# Patient Record
Sex: Female | Born: 1970 | Race: White | Hispanic: No | Marital: Single | Smoking: Former smoker
Health system: Southern US, Community
[De-identification: ages and names within clinical notes are randomized; demographics above are authoritative.]

## PROBLEM LIST (undated history)

## (undated) DIAGNOSIS — E559 Vitamin D deficiency, unspecified: Secondary | ICD-10-CM

## (undated) DIAGNOSIS — E039 Hypothyroidism, unspecified: Secondary | ICD-10-CM

## (undated) DIAGNOSIS — K635 Polyp of colon: Secondary | ICD-10-CM

## (undated) DIAGNOSIS — E669 Obesity, unspecified: Secondary | ICD-10-CM

## (undated) DIAGNOSIS — K279 Peptic ulcer, site unspecified, unspecified as acute or chronic, without hemorrhage or perforation: Secondary | ICD-10-CM

## (undated) HISTORY — DX: Hypothyroidism, unspecified: E03.9

## (undated) HISTORY — DX: Polyp of colon: K63.5

## (undated) HISTORY — DX: Obesity, unspecified: E66.9

## (undated) HISTORY — DX: Vitamin D deficiency, unspecified: E55.9

## (undated) HISTORY — PX: OTHER SURGICAL HISTORY: SHX169

## (undated) HISTORY — DX: Peptic ulcer, site unspecified, unspecified as acute or chronic, without hemorrhage or perforation: K27.9

---

## 2012-05-23 ENCOUNTER — Ambulatory Visit (INDEPENDENT_AMBULATORY_CARE_PROVIDER_SITE_OTHER): Payer: BC Managed Care – PPO | Admitting: Family Medicine

## 2012-05-23 VITALS — BP 126/86 | HR 83 | Temp 98.4°F | Resp 18 | Ht 64.0 in | Wt 166.4 lb

## 2012-05-23 DIAGNOSIS — J069 Acute upper respiratory infection, unspecified: Secondary | ICD-10-CM

## 2012-05-23 MED ORDER — AMOXICILLIN 875 MG PO TABS: ORAL_TABLET | ORAL | Status: DC

## 2012-05-23 NOTE — Progress Notes (Signed)
New Patient Visit:  HPI:  URI Symptoms Onset: 2-3 days  Description: nasal congestion, R ear congestion, R ear pain  Modifying factors:  none  Symptoms Nasal discharge: yes Fever: no Sore throat: minimal Cough: mild  Wheezing: no Ear pain: yes GI symptoms: yes Sick contacts: yes  Red Flags  Stiff neck: no Dyspnea: no Rash: no Swallowing difficulty: no  Sinusitis Risk Factors Headache/face pain: no Double sickening: no tooth pain: n  Allergy Risk Factors Sneezing: no Itchy scratchy throat: no Seasonal symptoms: no  Flu Risk Factors Headache: no muscle aches: no severe fatigue: no    There is no problem list on file for this patient.  Past Medical History: No past medical history on file.  Past Surgical History: No past surgical history on file.  Social History: History   Social History  . Marital Status: Single    Spouse Name: N/A    Number of Children: N/A  . Years of Education: N/A   Social History Main Topics  . Smoking status: Former Games developer  . Smokeless tobacco: None  . Alcohol Use: None  . Drug Use: None  . Sexually Active: None   Other Topics Concern  . None   Social History Narrative  . None    Family History: No family history on file.  Allergies: No Known Allergies  Current Outpatient Prescriptions  Medication Sig Dispense Refill  . amoxicillin (AMOXIL) 875 MG tablet 1 tab bid, use if symptoms not improved in 3-5 days (05/27/12)  20 tablet  0  . omeprazole (PRILOSEC) 20 MG capsule Take 20 mg by mouth 2 (two) times daily.      . sertraline (ZOLOFT) 25 MG tablet Take 25 mg by mouth daily.      . Vitamin D, Ergocalciferol, (DRISDOL) 50000 UNITS CAPS Take 50,000 Units by mouth.       Review Of Systems: 12 point ROS negative except as noted above in HPI.   Physical Exam: Filed Vitals:   05/23/12 1203  BP: 126/86  Pulse: 83  Temp: 98.4 F (36.9 C)  Resp: 18   General: alert and cooperative HEENT: PERRLA, extra ocular  movement intact and +nasal erythema, rhinorrhea bilaterally, + post oropharyngeal erythema  Heart: S1, S2 normal, no murmur, rub or gallop, regular rate and rhythm Lungs: clear to auscultation and no wheezes or rales Abdomen: abdomen is soft without significant tenderness, masses, organomegaly or guarding Extremities: extremities normal, atraumatic, no cyanosis or edema Skin:no rashes Neurology: normal without focal findings, mental status, speech normal, alert and oriented x3, PERLA and reflexes normal and symmetric  Labs and Imaging: No results found for this basename: na, k, cl, co2, bun, creatinine, glucose   No results found for this basename: WBC, HGB, HCT, MCV, PLT    Assessment and Plan: Suspect this is likely a URI.  Given ear pain, will give prophylactic Rx of amox if sxs not improved in 3-5 days.  Discussed infectious and resp red flags.  Follow up as needed.      The patient and/or caregiver has been counseled thoroughly with regard to treatment plan and/or medications prescribed including dosage, schedule, interactions, rationale for use, and possible side effects and they verbalize understanding. Diagnoses and expected course of recovery discussed and will return if not improved as expected or if the condition worsens. Patient and/or caregiver verbalized understanding.

## 2012-05-25 ENCOUNTER — Ambulatory Visit: Payer: BC Managed Care – PPO | Admitting: Family Medicine

## 2012-05-25 VITALS — BP 124/88 | HR 60 | Temp 98.2°F | Resp 16

## 2012-05-25 DIAGNOSIS — A281 Cat-scratch disease: Secondary | ICD-10-CM

## 2012-05-25 DIAGNOSIS — R509 Fever, unspecified: Secondary | ICD-10-CM

## 2012-05-25 LAB — POCT CBC
Granulocyte percent: 72.7 %G (ref 37–80)
HCT, POC: 48 % — AB (ref 37.7–47.9)
Lymph, poc: 1.2 (ref 0.6–3.4)
MCHC: 31.9 g/dL (ref 31.8–35.4)
MCV: 97.8 fL — AB (ref 80–97)
MID (cbc): 0.4 (ref 0–0.9)
POC LYMPH PERCENT: 20.7 %L (ref 10–50)
Platelet Count, POC: 248 10*3/uL (ref 142–424)
RDW, POC: 12.9 %

## 2012-05-25 MED ORDER — AZITHROMYCIN 250 MG PO TABS: ORAL_TABLET | ORAL | Status: AC

## 2012-05-25 NOTE — Progress Notes (Signed)
[facility] [address], [place] [address] [phone]  Date:  05/25/2012   Name:  Tamara Hicks   DOB:  1971/03/29   MRN:  [id number]  PCP:  No primary provider on file.    Chief Complaint: Follow-up   History of Present Illness:  Tamara Hicks is a 41 y.o. very pleasant female patient who presents with the following:  Here today for follow- up. Seen 2 days ago with URI symptoms.  She was diagnosed with a likely viral URI.   She was scratched by a cat on her face about one month ago.  The wound took a long time to heal.   Then a few days ago she developed some swelling of the lymph node on the right side of her jaw.  This was present at her visit on the 4th but was not nearly as bad as it is now.   She had a subjective temperature last night.  She had a sweat overnight.  She has not noted any other enlarged or tender nodes.  She did have nausea and vomiting for one day- a couple of days ago. Now ok.  She has some ST but this may be due to her enlarged node.   She did start taking amoxicillin 2 days ago.  This has not helped so far.   LMP was about 2 weeks ago.    Her daughter has also been seen here this week, and was treated for enlarged inginal node.  She also has been scratched by the same cats as her mother.    There is no problem list on file for this patient.   No past medical history on file.  No past surgical history on file.  History  Substance Use Topics  . Smoking status: Former Games developer  . Smokeless tobacco: Not on file  . Alcohol Use: Not on file    No family history on file.  No Known Allergies  Medication list has been reviewed and updated.  Current Outpatient Prescriptions on File Prior to Visit  Medication Sig Dispense Refill  . amoxicillin (AMOXIL) 875 MG tablet 1 tab bid, use if symptoms not improved in 3-5 days (05/27/12)  20 tablet  0  . omeprazole (PRILOSEC) 20 MG capsule Take 20 mg by mouth 2 (two) times daily.        . sertraline (ZOLOFT) 25 MG tablet Take 50 mg by mouth daily.       . Vitamin D, Ergocalciferol, (DRISDOL) 50000 UNITS CAPS Take 50,000 Units by mouth.        Review of Systems:  As per HPI- otherwise negative.   Physical Examination: Filed Vitals:   05/25/12 1329  BP: 124/88  Pulse: 60  Temp: 98.2 F (36.8 C)  Resp: 16   There were no vitals filed for this visit. There is no height or weight on file to calculate BMI. Ideal Body Weight:    GEN: WDWN, NAD, Non-toxic, A & O x 3 HEENT: Atraumatic, Normocephalic. Neck supple. No masses, No LAD. Visible swelling of right submandibular node.  Oropharynx wnl, TM wnl bilaterally. No other nodes in axillae or supraclavicular areas.   Ears and Nose: No external deformity. CV: RRR, No M/G/R. No JVD. No thrill. No extra heart sounds. PULM: CTA B, no wheezes, crackles, rhonchi. No retractions. No resp. distress. No accessory muscle use. ABD: S, NT, ND,  EXTR: No c/c/e NEURO Normal gait.  PSYCH: Normally interactive. Conversant. Not depressed or anxious  appearing.  Calm demeanor.   Results for orders placed in visit on 05/25/12  POCT CBC      Component Value Range   WBC 5.9  4.6 - 10.2 K/uL   Lymph, poc 1.2  0.6 - 3.4   POC LYMPH PERCENT 20.7  10 - 50 %L   MID (cbc) 0.4  0 - 0.9   POC MID % 6.6  0 - 12 %M   POC Granulocyte 4.3  2 - 6.9   Granulocyte percent 72.7  37 - 80 %G   RBC 4.91  4.04 - 5.48 M/uL   Hemoglobin 15.3  12.2 - 16.2 g/dL   HCT, POC 04.5 (*) 40.9 - 47.9 %   MCV 97.8 (*) 80 - 97 fL   MCH, POC 31.2  27 - 31.2 pg   MCHC 31.9  31.8 - 35.4 g/dL   RDW, POC 81.1     Platelet Count, POC 248  142 - 424 K/uL   MPV 8.3  0 - 99.8 fL    Assessment and Plan: 1. Fever  POCT CBC, azithromycin (ZITHROMAX) 250 MG tablet  2. Cat-scratch disease     Suspect that Demetrius and her daughter both have cat scratch disease.  Will stop her amox, and start on azithromycin today.  She is to let me know if she is not feeling much  better within 2 or 3 days- Sooner if worse.   Gave note for her job.   Abbe Amsterdam, MD

## 2013-09-10 ENCOUNTER — Ambulatory Visit (INDEPENDENT_AMBULATORY_CARE_PROVIDER_SITE_OTHER): Payer: BC Managed Care – PPO | Admitting: Family Medicine

## 2013-09-10 ENCOUNTER — Ambulatory Visit: Payer: BC Managed Care – PPO

## 2013-09-10 VITALS — BP 130/88 | HR 82 | Temp 98.0°F | Resp 16 | Ht 64.0 in | Wt 215.0 lb

## 2013-09-10 DIAGNOSIS — Y92009 Unspecified place in unspecified non-institutional (private) residence as the place of occurrence of the external cause: Secondary | ICD-10-CM

## 2013-09-10 DIAGNOSIS — S300XXA Contusion of lower back and pelvis, initial encounter: Secondary | ICD-10-CM

## 2013-09-10 DIAGNOSIS — S93402A Sprain of unspecified ligament of left ankle, initial encounter: Secondary | ICD-10-CM

## 2013-09-10 DIAGNOSIS — E669 Obesity, unspecified: Secondary | ICD-10-CM | POA: Insufficient documentation

## 2013-09-10 DIAGNOSIS — W19XXXA Unspecified fall, initial encounter: Secondary | ICD-10-CM

## 2013-09-10 DIAGNOSIS — S93409A Sprain of unspecified ligament of unspecified ankle, initial encounter: Secondary | ICD-10-CM

## 2013-09-10 MED ORDER — HYDROCODONE-ACETAMINOPHEN 5-325 MG PO TABS: 1.0000 | ORAL_TABLET | Freq: Three times a day (TID) | ORAL | Status: DC | PRN

## 2013-09-10 NOTE — Progress Notes (Signed)
Fit/train with crutches patient will be touch down weight bearing for this injury.

## 2013-09-10 NOTE — Patient Instructions (Signed)
Use the hydrocodone as needed for pain- remember it can make you sleepy so do not use it when you need to drive.    Use the "aircast" brace for your ankle as needed.  Take it easy. Use ice and heat as needed for your ankle and back.    Let me know if you are not better in the next few days- Sooner if worse.

## 2013-09-10 NOTE — Progress Notes (Signed)
[facility] [place] [address], [place] [address] [phone]  Date:  09/10/2013   Name:  Tamara Hicks   DOB:  1970/11/30   MRN:  [id number]  PCP:  No primary provider on file.    Chief Complaint: Fall   History of Present Illness:  Karl Wiederhold is a 42 y.o. very pleasant female patient who presents with the following:  Here today with an ankle injury.   Last night she slipped down several steps.  She was going down some steps while her feet were slick from a pedicure- her feet went out from under her.  She fell onto her behind- hurting her left ankle and her behind.  Her right ankle is a bit sore but not seriously.  She thinks she might have broken her tailbone.  She did not hit her head or have any LOC.    Her LMP was earlier this month.   She could not walk on her ankle for a couple of hours.  However her ankle then improved so she could walk.  However right now her back hurts a lot when she walks.  No numbness or weakness in her legs.   She is generally healthy except for overweight.   NKDA There are no active problems to display for this patient.   History reviewed. No pertinent past medical history.  Past Surgical History  Procedure Laterality Date  . Cesarean section      History  Substance Use Topics  . Smoking status: Former Games developer  . Smokeless tobacco: Not on file  . Alcohol Use: Not on file    Family History  Problem Relation Age of Onset  . Hypertension Mother     No Known Allergies  Medication list has been reviewed and updated.  Current Outpatient Prescriptions on File Prior to Visit  Medication Sig Dispense Refill  . omeprazole (PRILOSEC) 20 MG capsule Take 20 mg by mouth 2 (two) times daily.      Marland Kitchen amoxicillin (AMOXIL) 875 MG tablet 1 tab bid, use if symptoms not improved in 3-5 days (05/27/12)  20 tablet  0  . busPIRone (BUSPAR) 5 MG tablet Take 5 mg by mouth 3 (three) times daily.      . sertraline (ZOLOFT) 25 MG  tablet Take 50 mg by mouth daily.       . Vitamin D, Ergocalciferol, (DRISDOL) 50000 UNITS CAPS Take 50,000 Units by mouth.       No current facility-administered medications on file prior to visit.    Review of Systems:  As per HPI- otherwise negative.   Physical Examination: Filed Vitals:   09/10/13 0948  BP: 130/88  Pulse: 82  Temp: 98 F (36.7 C)  Resp: 16   Filed Vitals:   09/10/13 0948  Height: 5\' 4"  (1.626 m)  Weight: 215 lb (97.523 kg)   Body mass index is 36.89 kg/(m^2). Ideal Body Weight: Weight in (lb) to have BMI = 25: 145.3  GEN: WDWN, NAD, Non-toxic, A & O x 3, obese, looks well HEENT: Atraumatic, Normocephalic. Neck supple. No masses, No LAD.  Bilateral TM wnl, oropharynx normal.  PEERL,EOMI.   c spine is negative for tenderness, full ROM in all directions Ears and Nose: No external deformity. CV: RRR, No M/G/R. No JVD. No thrill. No extra heart sounds. PULM: CTA B, no wheezes, crackles, rhonchi. No retractions. No resp. distress. No accessory muscle use. ABD: S, NT, ND EXTR: No c/c/e NEURO Normal gait.  PSYCH:  Normally interactive. Conversant. Not depressed or anxious appearing.  Calm demeanor.  Left ankle: minimal puffiness laterally.  Tender over the lateral epicondyle.  No bruise, normal ankle ROM, foot is negative Achilles is intact Back: negaitve SLR bilaterally, no numbness or weakness, normal achilles DTR bilaterally.  She is tender over the lower back muscles bilaterally and over the coccyx  [facility] reading (PRIMARY) by  Dr. Patsy Lager. Left ankle:negative Sacrum/ coccyx:  Negative for fracture  LEFT ANKLE COMPLETE - 3+ VIEW  COMPARISON: None.  FINDINGS: No fracture. Ankle mortise is normally space and aligned. There are small dorsal plantar calcaneal spurs. The soft tissues are unremarkable.  IMPRESSION: No fracture or ankle joint abnormality  SACRUM AND COCCYX - 2+ VIEW  COMPARISON: None.  FINDINGS: No fracture is identified involving  the sacrum or coccyx. No bony lesions are identified. The sacroiliac joints have a symmetric and normal appearance.  IMPRESSION: Normal sacrum and coccyx.  Assessment and Plan: Fall at home, initial encounter  Sprain of left ankle, initial encounter - Plan: DG Ankle Complete Left, HYDROcodone-acetaminophen (NORCO/VICODIN) 5-325 MG per tablet  Contusion of coccyx, initial encounter - Plan: DG Sacrum/Coccyx, HYDROcodone-acetaminophen (NORCO/VICODIN) 5-325 MG per tablet  Ankle sprain, coccyx contusion Given an air cast and crutches for symptom relief.  vicodin for use prn if OTC pain medications are not enough.  Note for work for tomorrow.  See patient instructions for more details.     Signed Abbe Amsterdam, MD

## 2017-12-19 ENCOUNTER — Encounter (INDEPENDENT_AMBULATORY_CARE_PROVIDER_SITE_OTHER): Payer: Self-pay

## 2017-12-19 ENCOUNTER — Ambulatory Visit: Payer: Medicaid Other | Admitting: Neurology

## 2017-12-19 ENCOUNTER — Other Ambulatory Visit: Payer: Self-pay

## 2017-12-19 ENCOUNTER — Encounter: Payer: Self-pay | Admitting: Neurology

## 2017-12-19 VITALS — BP 144/101 | HR 91 | Ht 63.5 in | Wt 215.0 lb

## 2017-12-19 DIAGNOSIS — G8929 Other chronic pain: Secondary | ICD-10-CM | POA: Diagnosis not present

## 2017-12-19 DIAGNOSIS — M542 Cervicalgia: Secondary | ICD-10-CM

## 2017-12-19 MED ORDER — NORTRIPTYLINE HCL 10 MG PO CAPS: ORAL_CAPSULE | ORAL | 3 refills | Status: DC
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
## 2018-03-20 ENCOUNTER — Ambulatory Visit: Payer: Medicaid Other | Admitting: Nurse Practitioner

## 2018-03-20 ENCOUNTER — Encounter: Payer: Self-pay | Admitting: Nurse Practitioner

## 2018-03-20 ENCOUNTER — Telehealth: Payer: Self-pay | Admitting: Nurse Practitioner

## 2018-03-20 VITALS — BP 141/90 | HR 97 | Ht 63.5 in | Wt 231.2 lb

## 2018-03-20 DIAGNOSIS — M542 Cervicalgia: Secondary | ICD-10-CM

## 2018-03-20 DIAGNOSIS — Z6841 Body Mass Index (BMI) 40.0 and over, adult: Secondary | ICD-10-CM | POA: Diagnosis not present

## 2018-03-20 MED ORDER — TIZANIDINE HCL 4 MG PO TABS: 4.0000 mg | ORAL_TABLET | Freq: Four times a day (QID) | ORAL | 4 refills | Status: DC | PRN

## 2018-03-20 MED ORDER — NORTRIPTYLINE HCL 10 MG PO CAPS: ORAL_CAPSULE | ORAL | 4 refills | Status: DC

## 2018-03-20 NOTE — Patient Instructions (Signed)
Pt continues to have significant neck pain will get MRI  Continue Nortriptyline 10mg  (3caps) at night Add on tizanidine 4 mg as needed F/U 4 months

## 2018-03-20 NOTE — Telephone Encounter (Signed)
Medicaid order sent to [facility]. They will reach out to the pt to schedule and will obtain auth.

## 2018-04-06 ENCOUNTER — Other Ambulatory Visit: Payer: Self-pay

## 2018-04-20 ENCOUNTER — Ambulatory Visit
Admission: RE | Admit: 2018-04-20 | Discharge: 2018-04-20 | Disposition: A | Discharge status: Home / Self Care | Payer: Medicaid Other | Source: Ambulatory Visit | Attending: Nurse Practitioner | Admitting: Nurse Practitioner

## 2018-04-20 DIAGNOSIS — M542 Cervicalgia: Secondary | ICD-10-CM

## 2018-04-23 ENCOUNTER — Telehealth: Payer: Self-pay | Admitting: Neurology

## 2018-04-23 ENCOUNTER — Other Ambulatory Visit: Payer: Self-pay | Admitting: Neurology

## 2018-04-23 DIAGNOSIS — M899 Disorder of bone, unspecified: Secondary | ICD-10-CM

## 2018-04-23 NOTE — Telephone Encounter (Signed)
Medicaid order sent to [facility]. They will obtain the auth and will reach out to the pt to schedule.  °

## 2018-04-23 NOTE — Telephone Encounter (Signed)
I called the patient.  The MRI of the cervical spine does not show any surgically amenable lesions.  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  The patient is amenable to this.    MRI cervical 04/21/18:  IMPRESSION: This MRI of the cervical spine without contrast shows the following: 1.   There is mild spinal stenosis at C6-C7 and milder degenerative changes at C5-C6.  There is no nerve root compression or other levels 2.   There is abnormal signal involving the T2 vertebral body.  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  However, as there is some involvement of the pedicle consider confirming with CT imaging through the T2 vertebral body

## 2018-04-30 ENCOUNTER — Telehealth: Payer: Self-pay | Admitting: Neurology

## 2018-04-30 ENCOUNTER — Ambulatory Visit
Admission: RE | Admit: 2018-04-30 | Discharge: 2018-04-30 | Disposition: A | Discharge status: Home / Self Care | Payer: Medicaid Other | Source: Ambulatory Visit | Attending: Neurology | Admitting: Neurology

## 2018-04-30 DIAGNOSIS — M899 Disorder of bone, unspecified: Secondary | ICD-10-CM

## 2018-04-30 NOTE — Telephone Encounter (Signed)
I called the patient. The CT of the thoracic spine shows a benign bone lesion. 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 Not sure if the patient is aware of this, will send a note to the primary MD.   CT thoracic 04/30/18:  IMPRESSION: 1. No worrisome lesions of the thoracic spine. 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 2. 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 I recommend thyroid ultrasound for further characterization if this has not been previously assessed.

## 2018-06-19 ENCOUNTER — Ambulatory Visit (HOSPITAL_COMMUNITY)
Admission: EM | Admit: 2018-06-19 | Discharge: 2018-06-19 | Disposition: A | Discharge status: Home / Self Care | Payer: Medicaid Other | Attending: Family Medicine | Admitting: Family Medicine

## 2018-06-19 ENCOUNTER — Encounter (HOSPITAL_COMMUNITY): Payer: Self-pay | Admitting: Emergency Medicine

## 2018-06-19 ENCOUNTER — Ambulatory Visit (INDEPENDENT_AMBULATORY_CARE_PROVIDER_SITE_OTHER): Payer: Medicaid Other

## 2018-06-19 ENCOUNTER — Other Ambulatory Visit: Payer: Self-pay

## 2018-06-19 DIAGNOSIS — S99921A Unspecified injury of right foot, initial encounter: Secondary | ICD-10-CM

## 2018-06-19 NOTE — ED Provider Notes (Signed)
[facility]    CSN: [id number] Arrival date & time: 06/19/18  1720     History   Chief Complaint Chief Complaint  Patient presents with  . Fall  . Foot Injury    right    HPI Tamara Hicks is a 47 y.o. female no contributing past medical history presenting today for evaluation of right foot injury.  Patient had a fall last night, believes her foot got caught under a swinging door which caused her to fall.  She denies hitting head or loss of consciousness.  Since she has had pain mainly to her right pinky toe and distal foot.  States that earlier today her pain was relatively minimal and was weightbearing tolerably, but later this evening she has developed increased pain.  She also has a small cut to her toe.   HPI  Past Medical History:  Diagnosis Date  . Colon polyp   . Hypothyroidism   . Obesity   . Peptic ulcer disease    Gastric ulcer  . Vitamin D deficiency     Patient Active Problem List   Diagnosis Date Noted  . Neck pain 03/20/2018  . Obesity, unspecified 09/10/2013    Past Surgical History:  Procedure Laterality Date  . CESAREAN SECTION    . Renal calculi resection      OB History   None      Home Medications    Prior to Admission medications   Medication Sig Start Date End Date Taking? Authorizing Provider  buPROPion (WELLBUTRIN XL) 300 MG 24 hr tablet Take 300 mg by mouth daily. 02/22/18  Yes [provider]  dexlansoprazole (DEXILANT) 60 MG capsule Take 60 mg by mouth daily.   Yes [provider]  levothyroxine (SYNTHROID, LEVOTHROID) 50 MCG tablet Take 50 mcg by mouth daily before breakfast.   Yes [provider]  nortriptyline (PAMELOR) 10 MG capsule 3 capsules at night 03/20/18  Yes Nilda Riggs, NP  cetirizine (ZYRTEC) 10 MG tablet Take 10 mg by mouth daily.    [provider]  mesalamine (LIALDA) 1.2 g EC tablet Take 1.2 g by mouth daily with breakfast.  12/08/17   [provider]  PATANOL 0.1 % ophthalmic solution  12/28/17   [provider]  tiZANidine (ZANAFLEX) 4 MG tablet Take 1 tablet (4 mg total) by mouth every 6 (six) hours as needed for muscle spasms. 03/20/18   Nilda Riggs, NP    Family History Family History  Problem Relation Age of Onset  . Hypertension Mother   . Rheum arthritis Father   . Heart disease Father   . Pneumonia Father     Social History Social History   Tobacco Use  . Smoking status: Former Games developer  . Smokeless tobacco: Never Used  Substance Use Topics  . Alcohol use: Yes    Comment: 10 drinks per week  . Drug use: Never     Allergies   Patient has no known allergies.   Review of Systems Review of Systems  Constitutional: Negative for fatigue and fever.  Eyes: Negative for visual disturbance.  Respiratory: Negative for shortness of breath.   Cardiovascular: Negative for chest pain.  Gastrointestinal: Negative for abdominal pain, nausea and vomiting.  Musculoskeletal: Positive for arthralgias, gait problem, joint swelling and myalgias.  Skin: Positive for color change and wound. Negative for rash.  Neurological: Negative for dizziness, weakness, light-headedness and headaches.     Physical Exam Triage Vital Signs ED Triage Vitals [  06/19/18 1749]  Enc Vitals Group     BP (!) 144/102     Pulse Rate (!) 122     Resp      Temp 99.4 F (37.4 C)     Temp Source Oral     SpO2 98 %     Weight      Height      Head Circumference      Peak Flow      Pain Score 8     Pain Loc      Pain Edu?      Excl. in GC?    No data found.  Updated Vital Signs BP (!) 144/102 (BP Location: Left Arm)   Pulse (!) 122   Temp 99.4 F (37.4 C) (Oral)   LMP 05/29/2018 (Approximate)   SpO2 98%   Visual Acuity Right Eye Distance:   Left Eye Distance:   Bilateral Distance:    Right Eye Near:   Left Eye Near:    Bilateral Near:     Physical Exam  Constitutional: She is oriented to person, place, and  time. She appears well-developed and well-nourished. No distress.  No acute distress  HENT:  Head: Normocephalic and atraumatic.  Nose: Nose normal.  Eyes: Conjunctivae are normal.  Neck: Neck supple.  Cardiovascular: Normal rate and regular rhythm.  No murmur heard. Pulmonary/Chest: Effort normal and breath sounds normal. No respiratory distress.  Abdominal: Soft. She exhibits no distension. There is no tenderness.  Musculoskeletal: Normal range of motion. She exhibits no edema.  Tenderness to palpation of right pinky toe and distal fourth and fifth metatarsals, mild swelling and bruising over this area  Nontender along medial and lateral malleolus of right ankle, no swelling  Dorsalis pedis 2+  Neurological: She is alert and oriented to person, place, and time.  Skin: Skin is warm and dry.  Small linear superficial cut to pinky toe  Psychiatric: She has a normal mood and affect.  Nursing note and vitals reviewed.    UC Treatments / Results  Labs (all labs ordered are listed, but only abnormal results are displayed) Labs Reviewed - No data to display  EKG None  Radiology No results found.  Procedures Procedures (including critical care time)  Medications Ordered in UC Medications - No data to display  Initial Impression / Assessment and Plan / UC Course  I have reviewed the triage vital signs and the nursing notes.  Pertinent labs & imaging results that were available during my care of the patient were reviewed by me and considered in my medical decision making (see chart for details).    X-ray negative for fracture, likely sprain/contusion.  Anti-inflammatories, ice and elevate. Discussed strict return precautions. Patient verbalized understanding and is agreeable with plan.   Final Clinical Impressions(s) / UC Diagnoses   Final diagnoses:  Injury of right foot, initial encounter     Discharge Instructions     NO fracture Use anti-inflammatories for  pain/swelling. You may take up to 800 mg Ibuprofen every 8 hours with food. You may supplement Ibuprofen with Tylenol [phone] mg every 8 hours.   Ice and elevate  Follow up if symptoms not improving in 1-2 weeks   ED Prescriptions    None     Controlled Substance Prescriptions [place] Controlled Substance Registry consulted? Not Applicable   Lew Dawes, [place] 06/19/18 2132

## 2018-06-19 NOTE — Discharge Instructions (Signed)
NO fracture Use anti-inflammatories for pain/swelling. You may take up to 800 mg Ibuprofen every 8 hours with food. You may supplement Ibuprofen with Tylenol [phone] mg every 8 hours.   Ice and elevate  Follow up if symptoms not improving in 1-2 weeks

## 2018-06-19 NOTE — ED Triage Notes (Signed)
Pt reports falling last night with her foot possibly caught under a broken door.  She reports a laceration between her 1st and 2nd toe and pain in the foot.

## 2018-06-19 NOTE — ED Notes (Signed)
Hallie, pa applied ace wrap  

## 2018-07-05 DIAGNOSIS — E041 Nontoxic single thyroid nodule: Secondary | ICD-10-CM | POA: Insufficient documentation
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
## 2018-07-24 ENCOUNTER — Ambulatory Visit: Payer: Medicaid Other | Admitting: Nurse Practitioner

## 2018-07-24 ENCOUNTER — Encounter: Payer: Self-pay | Admitting: Nurse Practitioner

## 2018-07-24 VITALS — BP 132/85 | HR 97 | Ht 64.0 in | Wt 218.6 lb

## 2018-07-24 DIAGNOSIS — M542 Cervicalgia: Secondary | ICD-10-CM | POA: Diagnosis not present

## 2018-07-24 NOTE — Progress Notes (Signed)
I have read the note, and I agree with the clinical assessment and plan.  Gerrett Loman K Jevaun Strick   

## 2018-07-24 NOTE — Patient Instructions (Signed)
Continue Nortriptyline 10mg  (3caps) at night Continue tizanidine 4 mg as needed Neck exercises F/U 8 months

## 2018-09-03 ENCOUNTER — Other Ambulatory Visit: Payer: Self-pay | Admitting: Nurse Practitioner

## 2019-01-26 IMAGING — CT CT T SPINE W/O CM
1 series · 12 of 14 positions shown, 15 images · non-contrast
Comparison: MRI of the cervical spine dated 04/20/2018

CLINICAL DATA: Back pain. Abnormal appearance of the T1 and T2
vertebral bodies on cervical MRI dated 04/20/2018

EXAM:
CT THORACIC SPINE WITHOUT CONTRAST
TECHNIQUE: Multidetector CT images of the thoracic were obtained using the
standard protocol without intravenous contrast.

[Series 4: t spine soft · axial · 0.37mm/px · z∈[-336,-75]mm · 12 of 103 slices shown, 15 images]
[im 8/103  soft-tissue]
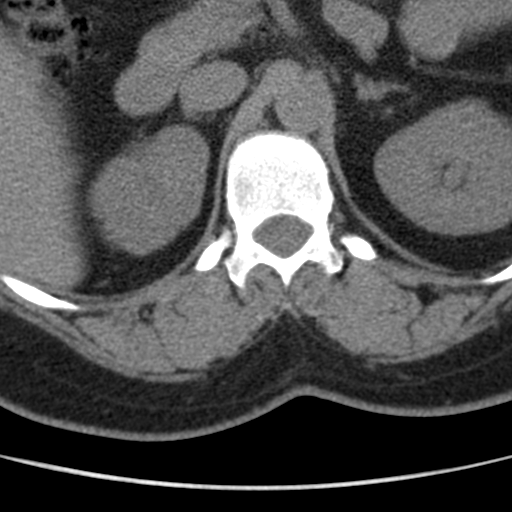
[im 8/103  bone]
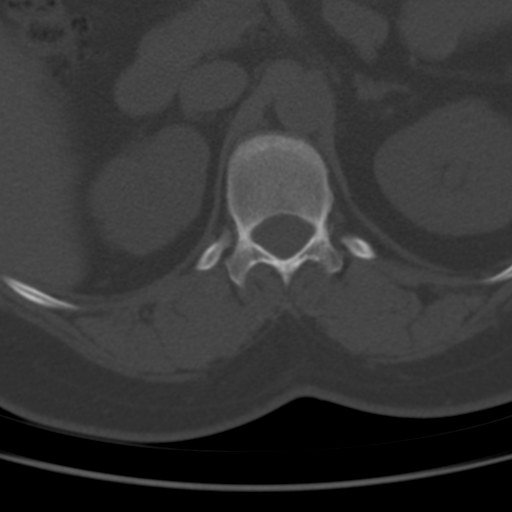
[im 16/103  bone]
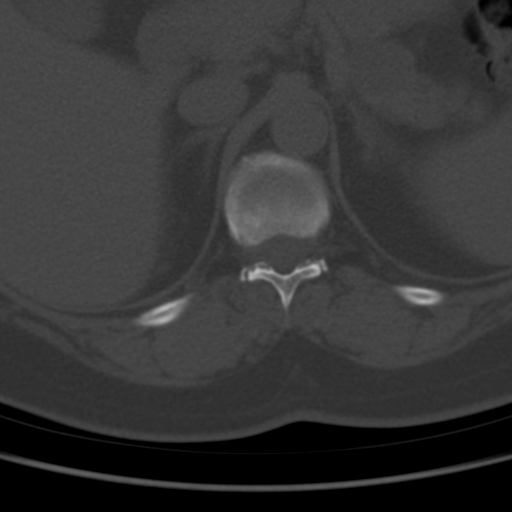
[im 24/103  bone]
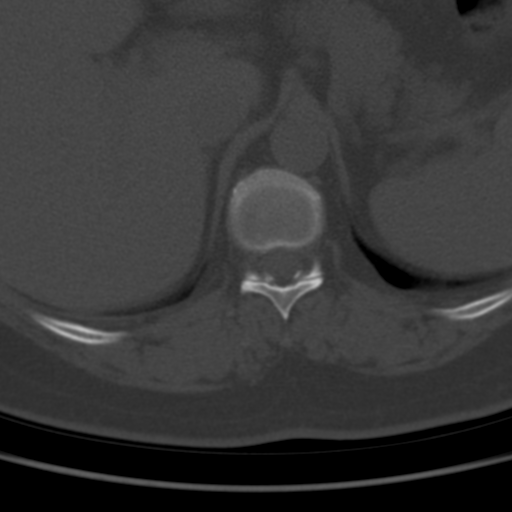
[im 32/103  bone]
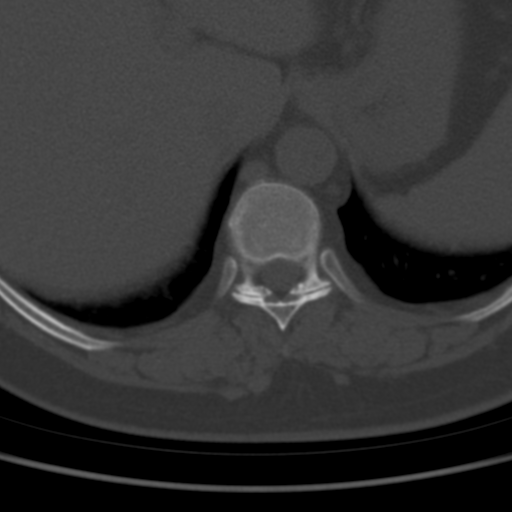
[im 40/103  soft-tissue]
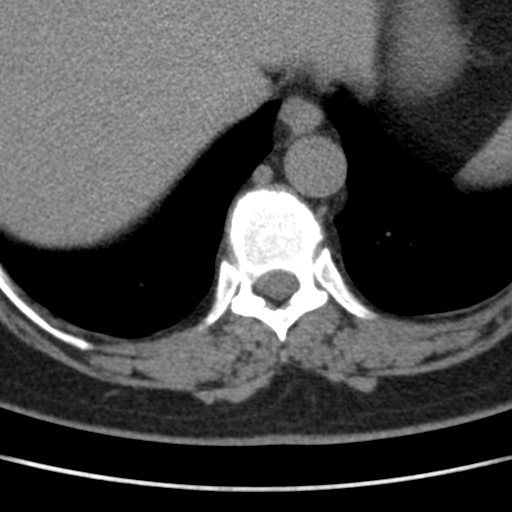
[im 40/103  bone]
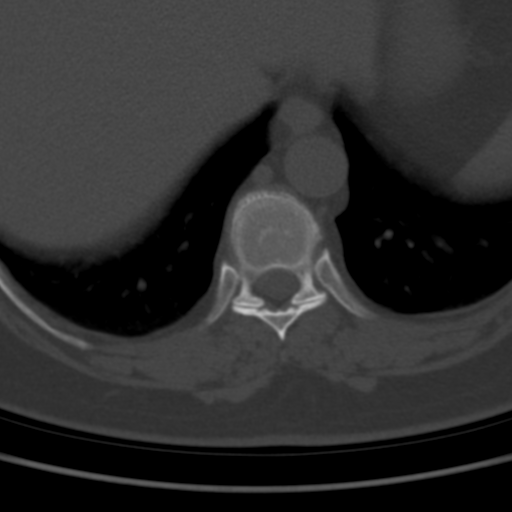
[im 48/103  bone]
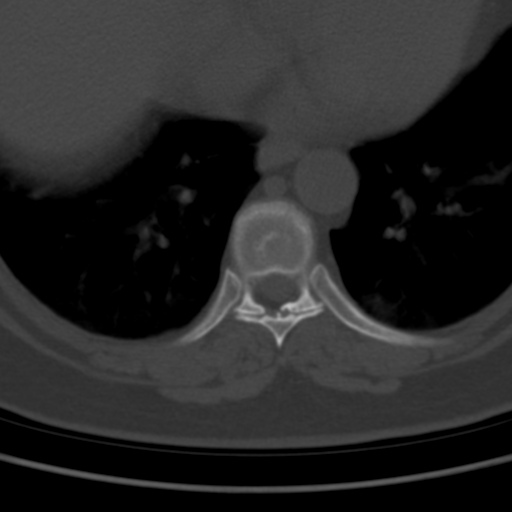
[im 55/103  bone]
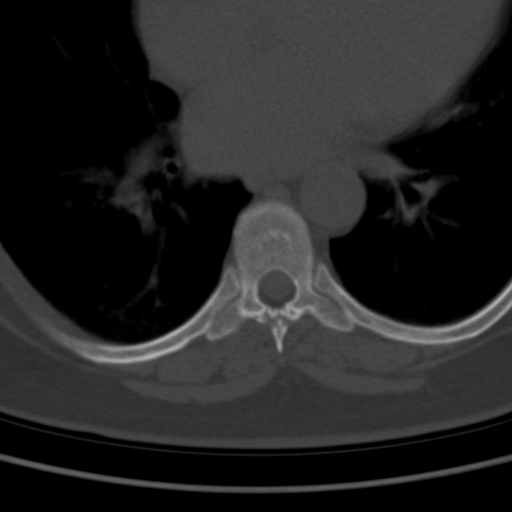
[im 63/103  bone]
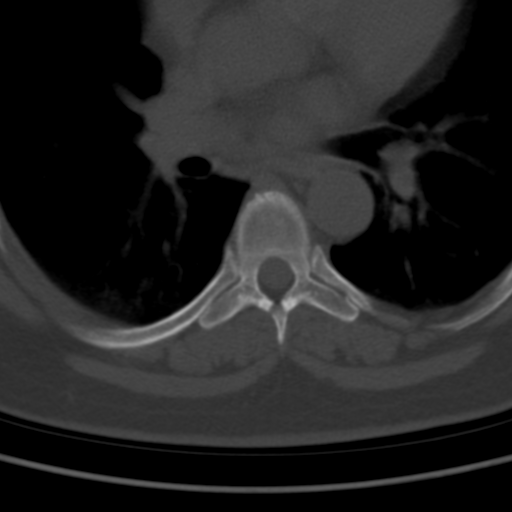
[im 71/103  soft-tissue]
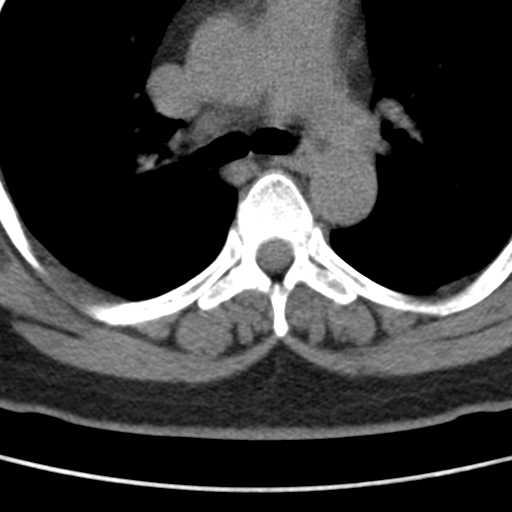
[im 71/103  bone]
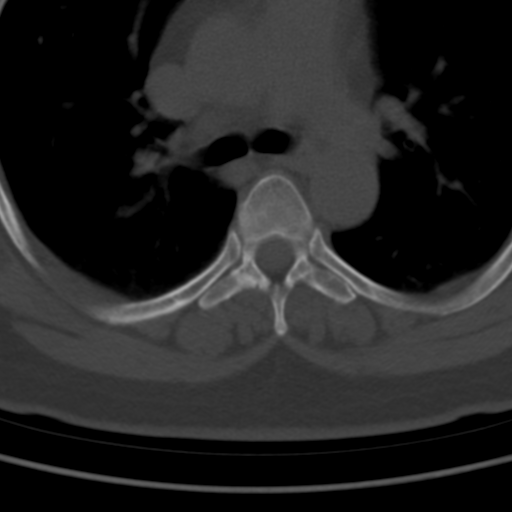
[im 79/103  bone]
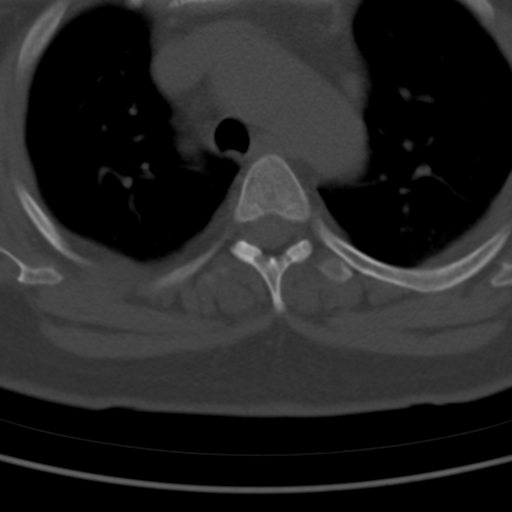
[im 87/103  bone]
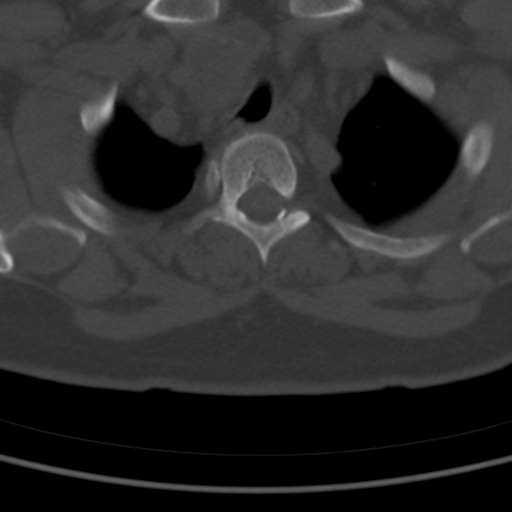
[im 95/103  bone]
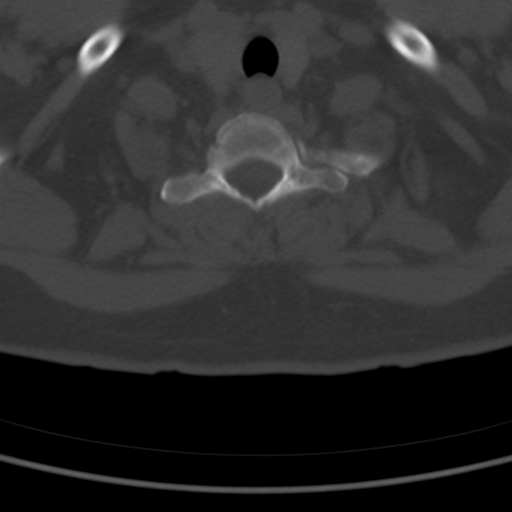

[12 of 14 positions shown; findings below may reference images not displayed]

FINDINGS: Alignment: Normal.

Vertebrae: There is a tiny hemangioma in the superior endplate of
T1. There is a slightly more prominent benign hemangioma in the T2
vertebral body. There is a more prominent benign hemangioma in the
T7 vertebral body. The vertebra are otherwise normal. No worrisome
masses or bone destruction.

Paraspinal and other soft tissues: There is a 3.9 x 3.4 x 2.5 cm
slightly lobulated inhomogeneous mass in the right lobe of the
thyroid gland. Slight renal atrophy and lobulation of unknown
etiology. No appreciable hydronephrosis.

Disc levels: There is no evidence of disc bulging or protrusion. No
spinal or foraminal stenosis. Minimal degenerative changes of the
facet joints at T7-8, T8-9 and T9-10.
IMPRESSION: 1. No worrisome lesions of the thoracic spine. The areas of
abnormality in T1 and T2 on cervical MRI is demonstrated be benign
hemangiomata. The patient also has a larger hemangioma in the T7
vertebral body.
2. Irregular partially cystic 3.9 cm mass in the right lobe of the
thyroid gland. I recommend thyroid ultrasound for further
characterization if this has not been previously assessed.

## 2019-03-26 NOTE — Progress Notes (Signed)
PATIENT: Tamara Hicks DOB: Jan 06, 1971  REASON FOR VISIT: follow up HISTORY FROM: patient  HISTORY OF PRESENT ILLNESS: Today 03/27/19 Tamara Hicks is a 48 year old female with history of neck pain.  She had MRI of her cervical spine 04/21/2018.  Mild spinal stenosis at C6-C7 and milder degenerative changes at C5-C6.  There is no nerve root compression.  There is nothing to be surgically repaired.  She stopped nortriptyline due to side effect of leg pain.  She was set up for physical therapy, however did not proceed.  Today she reports that her neck pain was under good control during the winter, for the last few months she has had more neck pain.  She reports the pain may radiate to her mid back and down her arms.  She says at night, she may wake up and her arms feel tingly.  She does report her body has been swelling, she has been gaining weight.  She denies urinary or bowel incontinence.  She denies any falls.  She recently started a new job.  Her primary care doctor has advised her to obtain a TENS unit and use diclofenac gel for her neck pain.  She has been taking tizanidine and that has been beneficial.  She presents today for follow-up unaccompanied.  HISTORY  11/5/19CM Tamara Hicks 48 year old female returns for follow-up with history of neck pain she was placed on Flexeril at her last visit and her neck pain has improved.  MRI of the cervical spine 04/21/2018 There is mild spinal stenosis at C6-C7 and milder degenerative changes at C5-C6. There is no nerve root compression or other levels 2. There is abnormal signal involving the T2 vertebral body. 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 However, as there is some involvement of the pedicle consider confirming with CT imaging through the T2 vertebral body There is nothing to be surgically repaired. 04/30/18  CT of the thoracic spine shows a benign bone lesion. 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   She had a thyroid biopsy 2 weeks ago that was benign She remains on nortriptyline 10 mg 3 capsules at night.  She was recently diagnosed with high blood pressure and is on hydrochlorothiazide.  She returns for reevaluation.  She did not go for her physical therapy  REVIEW OF SYSTEMS: Out of a complete 14 system review of symptoms, the patient complains only of the following symptoms, and all other reviewed systems are negative.  Neck pain  ALLERGIES: No Known Allergies  HOME MEDICATIONS: Outpatient Medications Prior to Visit  Medication Sig Dispense Refill   buPROPion (WELLBUTRIN XL) 300 MG 24 hr tablet Take 300 mg by mouth daily.  0   cetirizine (ZYRTEC) 10 MG tablet Take 10 mg by mouth daily.     dexlansoprazole (DEXILANT) 60 MG capsule Take 60 mg by mouth daily.     Diethylpropion HCl CR 75 MG TB24 Take 75 mg by mouth every morning.  0   hydrochlorothiazide (HYDRODIURIL) 12.5 MG tablet Take 12.5 mg by mouth daily.  1   levothyroxine (SYNTHROID, LEVOTHROID) 50 MCG tablet Take 50 mcg by mouth daily before breakfast.     mesalamine (LIALDA) 1.2 g EC tablet Take 1.2 g by mouth daily with breakfast.   1   nortriptyline (PAMELOR) 10 MG capsule TAKE 3 CAPSULES BY MOUTH AT NIGHT 90 capsule 8   PATANOL 0.1 % ophthalmic solution   1   tiZANidine (ZANAFLEX) 4 MG tablet TAKE 1  TABLET BY MOUTH EVERY 6 HOURS AS NEEDED FOR MUSCLE SPASM 60 tablet 8   No facility-administered medications prior to visit.     PAST MEDICAL HISTORY: Past Medical History:  Diagnosis Date   Colon polyp    Hypothyroidism    Obesity    Peptic ulcer disease    Gastric ulcer   Vitamin D deficiency     PAST SURGICAL HISTORY: Past Surgical History:  Procedure Laterality Date   CESAREAN SECTION     Renal calculi resection      FAMILY HISTORY: Family History  Problem Relation Age of Onset   Hypertension Mother    Rheum arthritis Father    Heart disease Father    Pneumonia Father      SOCIAL HISTORY: Social History   Socioeconomic History   Marital status: Single    Spouse name: Not on file   Number of children: 2   Years of education: Not on file   Highest education level: Not on file  Occupational History   Not on file  Social Needs   Financial resource strain: Not on file   Food insecurity    Worry: Not on file    Inability: Not on file   Transportation needs    Medical: Not on file    Non-medical: Not on file  Tobacco Use   Smoking status: Former Smoker   Smokeless tobacco: Never Used  Substance and Sexual Activity   Alcohol use: Yes    Comment: 10 drinks per week   Drug use: Never   Sexual activity: Not on file  Lifestyle   Physical activity    Days per week: Not on file    Minutes per session: Not on file   Stress: Not on file  Relationships   Social connections    Talks on phone: Not on file    Gets together: Not on file    Attends religious service: Not on file    Active member of club or organization: Not on file    Attends meetings of clubs or organizations: Not on file    Relationship status: Not on file   Intimate partner violence    Fear of current or ex partner: Not on file    Emotionally abused: Not on file    Physically abused: Not on file    Forced sexual activity: Not on file  Other Topics Concern   Not on file  Social History Narrative   Lives with daughter   Caffeine use: 1-3 cups coffee per day   Right handed      PHYSICAL EXAM  There were no vitals filed for this visit. There is no height or weight on file to calculate BMI.  Generalized: Well developed, in no acute distress   Neurological examination  Mentation: Alert oriented to time, place, history taking. Follows all commands speech and language fluent Cranial nerve II-XII: Pupils were equal round reactive to light. Extraocular movements were full, visual field were full on confrontational test. Facial sensation and strength were  normal. Uvula tongue midline. Head turning and shoulder shrug  were normal and symmetric. Motor: The motor testing reveals 5 over 5 strength of all 4 extremities. Good symmetric motor tone is noted throughout.  Sensory: Sensory testing is intact to soft touch on all 4 extremities. No evidence of extinction is noted.  Coordination: Cerebellar testing reveals good finger-nose-finger and heel-to-shin bilaterally.  Gait and station: Gait is normal. Tandem gait is normal. Romberg is negative. No drift  is seen.  Reflexes: Deep tendon reflexes are symmetric and normal bilaterally.   DIAGNOSTIC DATA (LABS, IMAGING, TESTING) - I reviewed patient records, labs, notes, testing and imaging myself where available.  Lab Results  Component Value Date   WBC 5.9 05/25/2012   HGB 15.3 05/25/2012   HCT 48.0 (A) 05/25/2012   MCV 97.8 (A) 05/25/2012   No results found for: NA, K, CL, CO2, GLUCOSE, BUN, CREATININE, CALCIUM, PROT, ALBUMIN, AST, ALT, ALKPHOS, BILITOT, GFRNONAA, GFRAA No results found for: CHOL, HDL, LDLCALC, LDLDIRECT, TRIG, CHOLHDL No results found for: [id number] No results found for: VITAMINB12 No results found for: TSH    ASSESSMENT AND PLAN 48 y.o. year old female  has a past medical history of Colon polyp, Hypothyroidism, Obesity, Peptic ulcer disease, and Vitamin D deficiency. here with:  1.  Neck pain  She had MRI of the cervical spine in August 2019, did not show anything surgical.  MRI of the cervical spine suggested CT scan for further evaluation of T2 vertebral body.  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  She complains of continued neck pain, at night she may wake up with intermittent tingling in her arms.  She stopped the nortriptyline, due to reported side effect.  She does not wish to start other medications.  She was referred to physical therapy in the past, however did not go.  At this  time she is interested in retrying physical therapy.  I will place the order.  Her primary care doctor has prescribed diclofenac gel, she can continue to use tizanidine as needed.  I did offer gabapentin, she does not wish to start any pills at this time.  Based on history, does not sound that her symptoms have progressed or changed (I have attached the imaging reports below).  She will continue to monitor and let me know if her symptoms change and we will re-evaluate if repeat imaging needs to be conducted.   CT Thoracic Spine 04/30/2018 IMPRESSION: 1. No worrisome lesions of the thoracic spine. 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 2. 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 I recommend thyroid ultrasound for further characterization if this has not been previously assessed  MRI Cervical Spine 04/20/2018 IMPRESSION: This MRI of the cervical spine without contrast shows the following: 1.   There is mild spinal stenosis at C6-C7 and milder degenerative changes at C5-C6.  There is no nerve root compression or other levels 2.   There is abnormal signal involving the T2 vertebral body.  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  However, as there is some involvement of the pedicle consider confirming with CT imaging through the T2 vertebral body  I spent 15 minutes with the patient. 50% of this time was spent discussing her plan of care.    Margie EgeSarah Shanetta Nicolls, AGNP-C, DNP 03/27/2019, 11:53 AM [facility] [address], [address] [place], [place] [address] [phone]

## 2019-03-27 ENCOUNTER — Ambulatory Visit: Payer: Medicaid Other | Admitting: Adult Health

## 2019-03-27 ENCOUNTER — Ambulatory Visit: Payer: Medicaid Other | Admitting: Neurology

## 2019-03-27 ENCOUNTER — Encounter: Payer: Self-pay | Admitting: Neurology

## 2019-03-27 ENCOUNTER — Other Ambulatory Visit: Payer: Self-pay

## 2019-03-27 VITALS — BP 134/82 | HR 86 | Temp 98.4°F | Ht 64.0 in | Wt 237.6 lb

## 2019-03-27 DIAGNOSIS — M542 Cervicalgia: Secondary | ICD-10-CM

## 2019-03-27 NOTE — Patient Instructions (Signed)
It was great to meet you! I have referred you for physical therapy for chronic neck pain. We will see you in 6 months!

## 2019-03-27 NOTE — Progress Notes (Signed)
I have read the note, and I agree with the clinical assessment and plan.  Carey Johndrow K Haydin Calandra   

## 2019-04-10 ENCOUNTER — Ambulatory Visit: Payer: Medicaid Other | Attending: Neurology | Admitting: Physical Therapy

## 2019-04-10 ENCOUNTER — Other Ambulatory Visit: Payer: Self-pay

## 2019-04-10 DIAGNOSIS — M545 Low back pain, unspecified: Secondary | ICD-10-CM

## 2019-04-10 DIAGNOSIS — M6281 Muscle weakness (generalized): Secondary | ICD-10-CM | POA: Diagnosis present

## 2019-04-10 DIAGNOSIS — M542 Cervicalgia: Secondary | ICD-10-CM | POA: Diagnosis not present

## 2019-04-10 DIAGNOSIS — G8929 Other chronic pain: Secondary | ICD-10-CM | POA: Insufficient documentation

## 2019-04-10 DIAGNOSIS — R293 Abnormal posture: Secondary | ICD-10-CM | POA: Insufficient documentation

## 2019-04-11 NOTE — Therapy (Addendum)
[facility] [address], [place], [place], [address] Phone: [phone]   Fax:  [phone]  Physical Therapy Evaluation  Patient Details  Name: Tamara Hicks MRN: [id number] Date of Birth: [birth year] Referring Provider (PT): Suzzanne Cloud, NP  Referred to PT: Suzzanne Cloud, NP Encounter Date: 04/10/2019    PT End of Session - 04/11/19 1636    Visit Number  1    Date for PT Re-Evaluation  07/03/19    Authorization Type  MCAID    Authorization - Visit Number  0    Authorization - Number of Visits  3    PT Start Time  1850    PT Stop Time  1930    PT Time Calculation (min)  40 min    Activity Tolerance  Patient tolerated treatment well    Behavior During Therapy  [facility] for tasks assessed/performed        Past Medical History:  Diagnosis Date  . Colon polyp   . Hypothyroidism   . Obesity   . Peptic ulcer disease    Gastric ulcer  . Vitamin D deficiency     Past Surgical History:  Procedure Laterality Date  . CESAREAN SECTION    . Renal calculi resection      There were no vitals filed for this visit.  Subjective Assessment - 04/10/19 1857    Subjective  Pt states she has had neck pain for 20 years due to an injury and then living in a house with mold.    Currently in Pain?  Yes    Pain Score  1     Pain Location  Neck    Pain Orientation  Left;Right   Lt>Rt   Pain Descriptors / Indicators  Nagging;Crying    Pain Type  Chronic pain    Pain Radiating Towards  up into the back of the head, down to bra strap    Pain Onset  More than a month ago    Pain Frequency  Intermittent    Aggravating Factors   not sure but more in the summertime    Pain Relieving Factors  steroid injection, biofreeze    Effect of Pain on Daily Activities  normal routine, but having more pain    Multiple Pain Sites  Yes    Pain Score  1   up to a 10 especially in foot   Pain Location  Generalized           [facility]  PT Assessment - 04/15/19 0001      Assessment   Medical Diagnosis  cervicalgia    Referring Provider (PT)  Suzzanne Cloud, NP    Onset Date/Surgical Date  --   20 years   Prior Therapy  Yes only went 2x      Precautions   Precautions  None      Restrictions   Weight Bearing Restrictions  No      Home Environment   Living Environment  Private residence    Living Arrangements  Other relatives      Prior Function   Level of Independence  Independent      Cognition   Overall Cognitive Status  Within Functional Limits for tasks assessed      Posture/Postural Control   Posture/Postural Control  Postural limitations    Postural Limitations  Increased thoracic kyphosis;Increased lumbar lordosis;Rounded Shoulders      ROM / Strength   AROM / PROM / Strength  Strength      AROM   Overall AROM Comments  forward flexion 50% limited    Cervical Flexion  55    Cervical Extension  48    Cervical - Right Side Bend  36    Cervical - Left Side Bend  34    Cervical - Right Rotation  55    Cervical - Left Rotation  50      Strength   Overall Strength Comments  UE overall 4+/5 ER, abduction and flexion      Flexibility   Soft Tissue Assessment /Muscle Length  yes    Hamstrings  10% limited bilateral      Palpation   Palpation comment  cervical and thoracic paraspinals tight, suboccipitals tight      Ambulation/Gait   Gait Pattern  Decreased step length - right;Decreased step length - left;Decreased stride length                Objective measurements completed on examination: See above findings.                PT Short Term Goals - 04/11/19 1918      PT SHORT TERM GOAL #1   Title  ind with initial HEP    Time  4    Period  Weeks    Status  New    Target Date  05/09/19        PT Long Term Goals - 04/11/19 1803      PT LONG TERM GOAL #1   Title  Pt will report 50% less pain    Time  12    Period  Weeks    Status  New    Target Date  07/03/19       PT LONG TERM GOAL #2   Title  Pt will improve cervical rotation to 60 degrees bilaterally    Time  12    Period  Weeks    Status  New    Target Date  07/03/19      PT LONG TERM GOAL #3   Title  Pt will report 50% less difficulty getting dressed and lifting 5-10lb overhead for improved ability to perform ADLs    Time  12    Period  Weeks    Status  New    Target Date  07/03/19      PT LONG TERM GOAL #4   Title  ind with HEP for pain management and ongoing maintenance of strength    Time  12    Period  Weeks    Status  New    Target Date  07/03/19         Plan - 04/11/19 1638    Clinical Impression Statement  Pt presents to skilled PT due to chronic neck pain primarily but has pain all throughout her neck and back.  Pt's pain is chronic in nature and she has had it ever since an injury at work.  Pt has hypomobility of her thoracic spine T4-8.  She demonstrates abdnormal posture and gait as mentioned above.  Pt has UE and core weakness.  She has decreased cervical ROM.  Pt will benefit from skilled PT to address impairments so she will be able to improve ability to perform functional activities without as much difficutly.    Personal Factors and Comorbidities  Past/Current Experience;Time since onset of injury/illness/exacerbation;Comorbidity 1    Comorbidities  chronic pain due to work injury, poor past experience with PT  Examination-Activity Limitations  Carry;Lift    Examination-Participation Restrictions  Laundry;Meal Prep    Stability/Clinical Decision Making  Evolving/Moderate complexity    Clinical Decision Making  Moderate    Rehab Potential  Good    PT Frequency  1x / week    PT Duration  12 weeks    PT Treatment/Interventions  ADLs/Self Care Home Management;Cryotherapy;Electrical Stimulation;Moist Heat;Traction;Therapeutic activities;Therapeutic exercise;Neuromuscular re-education;Patient/family education;Manual techniques;Passive range of motion;Dry needling;Taping     PT Next Visit Plan  cervical ROM, STM to cervical paraspinals and levator, rhomboids, thoracic P-A mobs, thoracic extension, core activation in supine    Consulted and Agree with Plan of Care  Patient              Patient will benefit from skilled therapeutic intervention in order to improve the following deficits and impairments:  Pain, Postural dysfunction, Impaired flexibility, Decreased strength, Decreased range of motion, Increased muscle spasms  Visit Diagnosis: 1. Cervicalgia   2. Chronic bilateral low back pain, unspecified whether sciatica present   3. Muscle weakness (generalized)   4. Abnormal posture        Problem List Patient Active Problem List   Diagnosis Date Noted  . Neck pain 03/20/2018  . Obesity, unspecified 09/10/2013    Junious SilkJakki L , PT 04/15/2019, 4:13 PM  [facility] [address], [address] [place], [place], [address] Phone: [phone]   Fax:  [phone]  Name: Tamara Hicks MRN: [id number] Date of Birth: 1970/12/21

## 2019-04-15 NOTE — Addendum Note (Signed)
Addended by: Su Hoff on: 04/15/2019 04:15 PM   Modules accepted: Orders

## 2019-04-22 ENCOUNTER — Ambulatory Visit: Payer: Medicaid Other | Attending: Neurology | Admitting: Physical Therapy

## 2019-04-22 ENCOUNTER — Encounter: Payer: Self-pay | Admitting: Physical Therapy

## 2019-04-22 ENCOUNTER — Other Ambulatory Visit: Payer: Self-pay

## 2019-04-22 DIAGNOSIS — M542 Cervicalgia: Secondary | ICD-10-CM | POA: Insufficient documentation

## 2019-04-22 DIAGNOSIS — R293 Abnormal posture: Secondary | ICD-10-CM | POA: Diagnosis present

## 2019-04-22 DIAGNOSIS — G8929 Other chronic pain: Secondary | ICD-10-CM | POA: Diagnosis present

## 2019-04-22 DIAGNOSIS — M545 Low back pain, unspecified: Secondary | ICD-10-CM

## 2019-04-22 DIAGNOSIS — M6281 Muscle weakness (generalized): Secondary | ICD-10-CM | POA: Diagnosis present

## 2019-04-22 NOTE — Therapy (Signed)
[facility] [address], [address] [place], [place], [address] Phone: [phone]   Fax:  [phone]  Physical Therapy Treatment  Patient Details  Name: Jerene CannyDemetrious Cumba MRN: [id number] Date of Birth: 01-27-1971 Referring Provider (PT): Glean SalvoSlack, Sarah J, NP   Encounter Date: 04/22/2019  PT End of Session - 04/22/19 1233    Visit Number  2    Date for PT Re-Evaluation  07/03/19    Authorization Type  MCAID    Authorization - Number of Visits  3    PT Start Time  1232    PT Stop Time  1312    PT Time Calculation (min)  40 min       Past Medical History:  Diagnosis Date  . Colon polyp   . Hypothyroidism   . Obesity   . Peptic ulcer disease    Gastric ulcer  . Vitamin D deficiency     Past Surgical History:  Procedure Laterality Date  . CESAREAN SECTION    . Renal calculi resection      There were no vitals filed for this visit.                    [facility] Adult PT Treatment/Exercise - 04/22/19 0001      Neck Exercises: Machines for Strengthening   Nustep  L1 2x2 min       Shoulder Exercises: Supine   Horizontal ABduction  Strengthening;Both;20 reps;Theraband    Theraband Level (Shoulder Horizontal ABduction)  Level 1 (Yellow)   Added to HEP     Shoulder Exercises: Seated   Other Seated Exercises  Scap squeezes 10x added for HEP      Manual Therapy   Soft tissue mobilization  Cervical: occiput, paraspinals, upper traps Bil. TP release RT upper trap      Neck Exercises: Stretches   Upper Trapezius Stretch  Right;Left;2 reps;20 seconds    Upper Trapezius Stretch Limitations  Addded to HEP             PT Education - 04/22/19 1246    Education Details  HEP: upper trap stretching    Person(s) Educated  Patient    Methods  Explanation;Demonstration;Tactile cues;Verbal cues;Handout    Comprehension  Verbalized understanding;Returned demonstration       PT Short Term Goals - 04/11/19  1918      PT SHORT TERM GOAL #1   Title  ind with initial HEP    Time  4    Period  Weeks    Status  New    Target Date  05/09/19        PT Long Term Goals - 04/11/19 1803      PT LONG TERM GOAL #1   Title  Pt will report 50% less pain    Time  12    Period  Weeks    Status  New    Target Date  07/03/19      PT LONG TERM GOAL #2   Title  Pt will improve cervical rotation to 60 degrees bilaterally    Time  12    Period  Weeks    Status  New    Target Date  07/03/19      PT LONG TERM GOAL #3   Title  Pt will report 50% less difficulty getting dressed and lifting 5-10lb overhead for improved ability to perform ADLs    Time  12    Period  Weeks  Status  New    Target Date  07/03/19      PT LONG TERM GOAL #4   Title  ind with HEP for pain management and ongoing maintenance of strength    Time  12    Period  Weeks    Status  New    Target Date  07/03/19            Plan - 04/22/19 1246    Clinical Impression Statement  Pt presents today "feeling prety good." Started pt on initial HEP for upper trap stretching and scapular awareness/strength. Pt reported this brought on popping at her anterior LT shoulder. Otherwise pt had no difficulties with any of the exercises.    Personal Factors and Comorbidities  Past/Current Experience;Time since onset of injury/illness/exacerbation;Comorbidity 1    Comorbidities  chronic pain due to work injury, poor past experience with PT    Examination-Activity Limitations  Carry;Lift    Examination-Participation Restrictions  Laundry;Meal Prep    Stability/Clinical Decision Making  Evolving/Moderate complexity    Rehab Potential  Good    PT Frequency  1x / week    PT Duration  12 weeks    PT Treatment/Interventions  ADLs/Self Care Home Management;Cryotherapy;Electrical Stimulation;Moist Heat;Traction;Therapeutic activities;Therapeutic exercise;Neuromuscular re-education;Patient/family education;Manual techniques;Passive range of  motion;Dry needling;Taping    PT Next Visit Plan  Review HEP given today. Pt may transfer to [employer] it is closer to her work, consider DN to upper traps    PT Home Exercise Plan  Access Code: [email or web address]    Consulted and Agree with Plan of Care  Patient       Patient will benefit from skilled therapeutic intervention in order to improve the following deficits and impairments:  Pain, Postural dysfunction, Impaired flexibility, Decreased strength, Decreased range of motion, Increased muscle spasms  Visit Diagnosis: 1. Cervicalgia   2. Chronic bilateral low back pain, unspecified whether sciatica present   3. Muscle weakness (generalized)   4. Abnormal posture        Problem List Patient Active Problem List   Diagnosis Date Noted  . Neck pain 03/20/2018  . Obesity, unspecified 09/10/2013    Braeson Rupe, PTA 04/22/2019, 1:58 PM  [facility] [address], [place], [place], [address] Phone: [phone]   Fax:  [phone]  Name: Shiesha Jahn MRN: [id number] Date of Birth: [birth year]  Access Code: [email or web address]  URL: [email or web address]  Date: 04/22/2019  Prepared by: Myrene Galas   Exercises  Seated Gentle Upper Trapezius Stretch - 2 reps - 1 sets - 20 hold - 3x daily - 7x weekly  Seated Scapular Retraction - 10 reps - 1 sets - 5 hold - 3x daily - 7x weekly  Supine Shoulder Horizontal Abduction with Resistance - 10 reps - 2 sets - 1x daily - 7x weekly

## 2019-04-29 ENCOUNTER — Other Ambulatory Visit: Payer: Self-pay

## 2019-04-29 ENCOUNTER — Ambulatory Visit: Payer: Medicaid Other | Admitting: Physical Therapy

## 2019-04-29 ENCOUNTER — Encounter: Payer: Self-pay | Admitting: Physical Therapy

## 2019-04-29 DIAGNOSIS — R293 Abnormal posture: Secondary | ICD-10-CM

## 2019-04-29 DIAGNOSIS — M6281 Muscle weakness (generalized): Secondary | ICD-10-CM

## 2019-04-29 DIAGNOSIS — G8929 Other chronic pain: Secondary | ICD-10-CM

## 2019-04-29 DIAGNOSIS — M542 Cervicalgia: Secondary | ICD-10-CM

## 2019-04-29 NOTE — Therapy (Signed)
[facility] [address], [address] [place], [place], [address] Phone: [phone]   Fax:  [phone]  Physical Therapy Treatment  Patient Details  Name: Tamara Hicks MRN: [id number] Date of Birth: 22-Apr-1971 Referring Provider (PT): Glean SalvoSlack, Sarah J, NP   Encounter Date: 04/29/2019  PT End of Session - 04/29/19 1236    Visit Number  3    Date for PT Re-Evaluation  07/03/19    Authorization Type  MCAID    Authorization - Visit Number  3    Authorization - Number of Visits  3    PT Start Time  1236    PT Stop Time  1315    PT Time Calculation (min)  39 min    Activity Tolerance  Patient tolerated treatment well    Behavior During Therapy  [facility] for tasks assessed/performed       Past Medical History:  Diagnosis Date  . Colon polyp   . Hypothyroidism   . Obesity   . Peptic ulcer disease    Gastric ulcer  . Vitamin D deficiency     Past Surgical History:  Procedure Laterality Date  . CESAREAN SECTION    . Renal calculi resection      There were no vitals filed for this visit.  Subjective Assessment - 04/29/19 1238    Subjective  Next day and day after that in the morning behind LT shoulder was hurting but ok after that.    Currently in Pain?  Yes    Pain Score  2     Pain Location  --   Lt middle of upper trap  and down to scapula   Pain Orientation  Left    Pain Descriptors / Indicators  Aching    Pain Onset  More than a month ago    Aggravating Factors   not sure, letting go of the stretches    Pain Relieving Factors  biofreeze, masage                       [facility] Adult PT Treatment/Exercise - 04/29/19 0001      Shoulder Exercises: Supine   Horizontal ABduction  Strengthening;Both;10 reps;Theraband    Theraband Level (Shoulder Horizontal ABduction)  Level 1 (Yellow)    Diagonals  Strengthening;Both;10 reps;Theraband    Theraband Level (Shoulder Diagonals)  Level 1 (Yellow)    Other  Supine Exercises  Retractions/Yoga baby fish  5x 3 sec hold      Shoulder Exercises: ROM/Strengthening   UBE (Upper Arm Bike)  L1 3x3 with postural emphasis      Manual Therapy   Soft tissue mobilization  Cervical: occiput, paraspinals, upper traps Bil. TP release LT upper trap, distraction to cervical 3x 30 sec             PT Education - 04/29/19 1307    Education Details  Yellow band diagonals for additional HEP    Person(s) Educated  Patient    Methods  Explanation;Demonstration;Verbal cues;Handout    Comprehension  Returned demonstration;Verbalized understanding       PT Short Term Goals - 04/11/19 1918      PT SHORT TERM GOAL #1   Title  ind with initial HEP    Time  4    Period  Weeks    Status  New    Target Date  05/09/19        PT Long Term Goals - 04/11/19 1803  PT LONG TERM GOAL #1   Title  Pt will report 50% less pain    Time  12    Period  Weeks    Status  New    Target Date  07/03/19      PT LONG TERM GOAL #2   Title  Pt will improve cervical rotation to 60 degrees bilaterally    Time  12    Period  Weeks    Status  New    Target Date  07/03/19      PT LONG TERM GOAL #3   Title  Pt will report 50% less difficulty getting dressed and lifting 5-10lb overhead for improved ability to perform ADLs    Time  12    Period  Weeks    Status  New    Target Date  07/03/19      PT LONG TERM GOAL #4   Title  ind with HEP for pain management and ongoing maintenance of strength    Time  12    Period  Weeks    Status  New    Target Date  07/03/19            Plan - 04/29/19 1300    Clinical Impression Statement  Pt arrives today with some aching posterior LT shoulder. Later on in treatment she describes it as burning. Soft tissues were substaintially tight today compared to last session LT> RT. Pt may benefit from DN to the left scapular and cervical muscles. She is compliant with her initial HEP and finds the stretching hard, makes her sore.     Personal Factors and Comorbidities  Past/Current Experience;Time since onset of injury/illness/exacerbation;Comorbidity 1    Comorbidities  chronic pain due to work injury, poor past experience with PT    Examination-Activity Limitations  Carry;Lift    Examination-Participation Restrictions  Laundry;Meal Prep    Stability/Clinical Decision Making  Evolving/Moderate complexity    Rehab Potential  Good    PT Frequency  1x / week    PT Duration  12 weeks    PT Treatment/Interventions  ADLs/Self Care Home Management;Cryotherapy;Electrical Stimulation;Moist Heat;Traction;Therapeutic activities;Therapeutic exercise;Neuromuscular re-education;Patient/family education;Manual techniques;Passive range of motion;Dry needling;Taping    PT Next Visit Plan  Pt might be interested in trying dry needling next session to her Lt upper trap and scapular muscles.    PT Home Exercise Plan  Access Code: [email or web address]    Consulted and Agree with Plan of Care  Patient       Patient will benefit from skilled therapeutic intervention in order to improve the following deficits and impairments:  Pain, Postural dysfunction, Impaired flexibility, Decreased strength, Decreased range of motion, Increased muscle spasms  Visit Diagnosis: 1. Cervicalgia   2. Chronic bilateral low back pain, unspecified whether sciatica present   3. Muscle weakness (generalized)   4. Abnormal posture        Problem List Patient Active Problem List   Diagnosis Date Noted  . Neck pain 03/20/2018  . Obesity, unspecified 09/10/2013    COCHRAN,JENNIFER, PTA 04/29/2019, 1:56 PM  [facility] [address], [address] [place], [place], [address] Phone: [phone]   Fax:  [phone]  Name: Tamara Hicks MRN: [id number] Date of Birth: [birth year]  Access Code: [email or web address]  URL: [email or web address]  Date: 04/29/2019  Prepared by: Ane PaymentJennifer Cochran   Exercises   Seated Gentle Upper Trapezius Stretch - 2 reps - 1 sets - 20 hold - 3x daily - 7x weekly  Seated  Scapular Retraction - 10 reps - 1 sets - 5 hold - 3x daily - 7x weekly  Supine Shoulder Horizontal Abduction with Resistance - 10 reps - 2 sets - 1x daily - 7x weekly  Standing Shoulder Horizontal and Diagonal Pulls with Resistance - 10 reps - 1 sets - 1x daily - 7x weekly

## 2019-05-06 ENCOUNTER — Ambulatory Visit: Payer: Medicaid Other | Admitting: Physical Therapy

## 2019-05-06 ENCOUNTER — Other Ambulatory Visit: Payer: Self-pay

## 2019-05-06 ENCOUNTER — Encounter: Payer: Self-pay | Admitting: Physical Therapy

## 2019-05-06 DIAGNOSIS — G8929 Other chronic pain: Secondary | ICD-10-CM

## 2019-05-06 DIAGNOSIS — R293 Abnormal posture: Secondary | ICD-10-CM

## 2019-05-06 DIAGNOSIS — M542 Cervicalgia: Secondary | ICD-10-CM | POA: Diagnosis not present

## 2019-05-06 DIAGNOSIS — M6281 Muscle weakness (generalized): Secondary | ICD-10-CM

## 2019-05-06 NOTE — Therapy (Signed)
[facility] [address], [address] [place], [place], [address] Phone: [phone]   Fax:  [phone]  Physical Therapy Treatment  Patient Details  Name: Tamara CannyDemetrious Warne MRN: [id number] Date of Birth: [birth year] Referring Provider (PT): Glean SalvoSlack, Sarah J, NP   Encounter Date: 05/06/2019  PT End of Session - 05/06/19 1231    Visit Number  4    Date for PT Re-Evaluation  07/03/19    Authorization Type  MCAID    Authorization - Visit Number  3    Authorization - Number of Visits  3    PT Start Time  1232    PT Stop Time  1311    PT Time Calculation (min)  39 min    Activity Tolerance  Patient tolerated treatment well    Behavior During Therapy  [facility] for tasks assessed/performed       Past Medical History:  Diagnosis Date  . Colon polyp   . Hypothyroidism   . Obesity   . Peptic ulcer disease    Gastric ulcer  . Vitamin D deficiency     Past Surgical History:  Procedure Laterality Date  . CESAREAN SECTION    . Renal calculi resection      There were no vitals filed for this visit.  Subjective Assessment - 05/06/19 1237    Subjective  My neck feels 60% better.  I had chicken the other day and I am in pain all over now.    Currently in Pain?  Yes    Pain Score  2     Pain Location  Shoulder    Pain Orientation  Left    Pain Descriptors / Indicators  Aching    Pain Type  Chronic pain    Pain Radiating Towards  muscle in the mid back is tight on the same side    Pain Onset  More than a month ago    Pain Frequency  Intermittent    Multiple Pain Sites  No         [facility] PT Assessment - 05/06/19 0001      Assessment   Medical Diagnosis  cervicalgia    Referring Provider (PT)  Glean SalvoSlack, Sarah J, NP      AROM   Cervical - Right Rotation  55    Cervical - Left Rotation  55      Strength   Strength Assessment Site  Shoulder    Right/Left Shoulder  Right;Left    Right Shoulder Flexion  4/5    Right Shoulder  ABduction  4+/5    Right Shoulder Internal Rotation  5/5    Right Shoulder External Rotation  4/5    Right Shoulder Horizontal ABduction  4/5    Right Shoulder Horizontal ADduction  4+/5    Left Shoulder Flexion  4/5    Left Shoulder Extension  5/5    Left Shoulder ABduction  4+/5    Left Shoulder Internal Rotation  5/5    Left Shoulder External Rotation  5/5    Left Shoulder Horizontal ABduction  4/5    Left Shoulder Horizontal ADduction  4+/5                   [facility] Adult PT Treatment/Exercise - 05/06/19 0001      Shoulder Exercises: Supine   Horizontal ABduction  Strengthening;Both;10 reps;Theraband    Theraband Level (Shoulder Horizontal ABduction)  Level 1 (Yellow)    Diagonals  Strengthening;Both;10 reps;Theraband  Theraband Level (Shoulder Diagonals)  Level 1 (Yellow)    Other Supine Exercises  open book pec stretch with towel verticle; UE overhead for extension - 6x 5sec hold      Shoulder Exercises: Seated   Other Seated Exercises  thoracic rotation -       Shoulder Exercises: ROM/Strengthening   Nustep  L1 x 6 min   PT present to get status     Manual Therapy   Soft tissue mobilization  Cervical: occiput, paraspinals, upper traps Bil. TP release RT upper trap, distraction to cervical 3x 30 sec      Neck Exercises: Stretches   Upper Trapezius Stretch  Right;Left;2 reps;20 seconds    Upper Trapezius Stretch Limitations  Addded to HEP               PT Short Term Goals - 05/06/19 1317      PT SHORT TERM GOAL #1   Title  ind with initial HEP    Status  Achieved        PT Long Term Goals - 05/06/19 1240      PT LONG TERM GOAL #1   Title  Pt will report 50% less pain    Baseline  neck and shoulder 60% better      PT LONG TERM GOAL #2   Title  Pt will improve cervical rotation to 60 degrees bilaterally    Baseline  55 deg bilat    Period  Weeks    Status  On-going      PT LONG TERM GOAL #3   Title  Pt will report 50% less difficulty  getting dressed and lifting 5-10lb overhead for improved ability to perform ADLs    Baseline  25%    Status  On-going      PT LONG TERM GOAL #4   Title  ind with HEP for pain management and ongoing maintenance of strength    Status  On-going            Plan - 05/06/19 1319    Clinical Impression Statement  Pt is making good progress with reports of 60% improvement of her neck and shoulder pain.  She continues to get muscle spasms but they are less tender than at the inital assessment.  Rt cervical paraspinals and UT were much tighter than Lt today and she had some spasms in upper thoracic paraspinals.  [facility] and [facility].  She will benefit from skilled PT to continue to progress towards functional goals.  She is expected to progress slowly given the chronic nature of her impairments.    Comorbidities  chronic pain due to work injury, poor past experience with PT    PT Treatment/Interventions  ADLs/Self Care Home Management;Cryotherapy;Electrical Stimulation;Moist Heat;Traction;Therapeutic activities;Therapeutic exercise;Neuromuscular re-education;Patient/family education;Manual techniques;Passive range of motion;Dry needling;Taping    PT Next Visit Plan  Pt might be interested in trying dry needling next session to her Lt upper trap and scapular muscles.    PT Home Exercise Plan  Access Code: [email or web address]    Consulted and Agree with Plan of Care  Patient       Patient will benefit from skilled therapeutic intervention in order to improve the following deficits and impairments:  Pain, Postural dysfunction, Impaired flexibility, Decreased strength, Decreased range of motion, Increased muscle spasms  Visit Diagnosis: 1. Cervicalgia   2. Chronic bilateral low back pain, unspecified whether sciatica present   3. Muscle weakness (generalized)   4. Abnormal  posture        Problem List Patient Active Problem List   Diagnosis Date Noted  . Neck pain  03/20/2018  . Obesity, unspecified 09/10/2013    Jule Ser, PT 05/06/2019, 1:32 PM  [facility] [address], [place], [place], [address] Phone: [phone]   Fax:  [phone]  Name: Merion Caton MRN: [id number] Date of Birth: 01-29-1971

## 2019-05-16 ENCOUNTER — Ambulatory Visit: Payer: Medicaid Other | Admitting: Physical Therapy

## 2019-05-16 ENCOUNTER — Other Ambulatory Visit: Payer: Self-pay

## 2019-05-16 ENCOUNTER — Encounter: Payer: Self-pay | Admitting: Physical Therapy

## 2019-05-16 DIAGNOSIS — R293 Abnormal posture: Secondary | ICD-10-CM

## 2019-05-16 DIAGNOSIS — G8929 Other chronic pain: Secondary | ICD-10-CM

## 2019-05-16 DIAGNOSIS — M542 Cervicalgia: Secondary | ICD-10-CM

## 2019-05-16 DIAGNOSIS — M545 Low back pain, unspecified: Secondary | ICD-10-CM

## 2019-05-16 DIAGNOSIS — M6281 Muscle weakness (generalized): Secondary | ICD-10-CM

## 2019-05-16 NOTE — Therapy (Signed)
Tamara Hicks, [place], [address] Phone: [phone]   Fax:  [phone]  Physical Therapy Treatment  Patient Details  Name: Tamara Hicks MRN: [id number] Date of Birth: 07-03-1971 Referring Provider (PT): Tamara Cloud, NP   Encounter Date: 05/16/2019  PT End of Session - 05/16/19 1721    Visit Number  5    Date for PT Re-Evaluation  07/03/19    Authorization Type  MCAID    Authorization Time Period  8/24-10/18    Authorization - Visit Number  1    Authorization - Number of Visits  8    PT Start Time  8850    PT Stop Time  1756    PT Time Calculation (min)  39 min    Activity Tolerance  Patient tolerated treatment well    Behavior During Therapy  [facility] for tasks assessed/performed       Past Medical History:  Diagnosis Date  . Colon polyp   . Hypothyroidism   . Obesity   . Peptic ulcer disease    Gastric ulcer  . Vitamin D deficiency     Past Surgical History:  Procedure Laterality Date  . CESAREAN SECTION    . Renal calculi resection      There were no vitals filed for this visit.  Subjective Assessment - 05/16/19 1722    Subjective  Neck is not too bad, pain at C7-T1 area, closer to Lt side. In last week, has had numbness in digits 3-5 on palmar aspect. doing HEP "for the most part"    Currently in Pain?  Yes    Pain Score  1     Pain Location  Neck    Pain Orientation  Lower    Pain Descriptors / Indicators  Radiating    Pain Radiating Towards  Lt scapula         [facility] PT Assessment - 05/16/19 0001      Assessment   Medical Diagnosis  cervicalgia    Referring Provider (PT)  Tamara Cloud, NP                   [facility] Adult PT Treatment/Exercise - 05/16/19 0001      Shoulder Exercises: Standing   External Rotation  Both;20 reps    Theraband Level (Shoulder External Rotation)  Level 1 (Yellow)    Extension  20 reps    Theraband Level (Shoulder Extension)  Level  1 (Yellow)    Row  20 reps    Theraband Level (Shoulder Row)  Level 3 (Green)    Other Standing Exercises  row isometric hold with step backs, blue tband      Shoulder Exercises: ROM/Strengthening   UBE (Upper Arm Bike)  retro 3 min L1      Shoulder Exercises: Stretch   Corner Stretch Limitations  door pec stretch    Star Gazer Stretch  5 reps   seated in chair     Manual Therapy   Manual therapy comments  skilled palpation and monitoring during TPDN    Soft tissue mobilization  Lt upper trap       Trigger Point Dry Needling - 05/16/19 0001    Consent Given?  Yes    Education Handout Provided  --   verbal education   Muscles Treated Head and Neck  Upper trapezius    Upper Trapezius Response  Twitch reponse elicited;Palpable increased muscle length   left  PT Education - 05/16/19 1758    Education Details  discussion of symtpoms as this is my first time seeing her, TPDN & expected outcomes.    Person(s) Educated  Patient    Methods  Explanation;Demonstration;Tactile cues;Verbal cues    Comprehension  Verbalized understanding;Returned demonstration;Verbal cues required;Tactile cues required;Need further instruction       PT Short Term Goals - 05/06/19 1317      PT SHORT TERM GOAL #1   Title  ind with initial HEP    Status  Achieved        PT Long Term Goals - 05/06/19 1240      PT LONG TERM GOAL #1   Title  Pt will report 50% less pain    Baseline  neck and shoulder 60% better      PT LONG TERM GOAL #2   Title  Pt will improve cervical rotation to 60 degrees bilaterally    Baseline  55 deg bilat    Period  Weeks    Status  On-going      PT LONG TERM GOAL #3   Title  Pt will report 50% less difficulty getting dressed and lifting 5-10lb overhead for improved ability to perform ADLs    Baseline  25%    Status  On-going      PT LONG TERM GOAL #4   Title  ind with HEP for pain management and ongoing maintenance of strength    Baseline  still  learning    Status  On-going            Plan - 05/16/19 1756    Clinical Impression Statement  Good tolerance to exercises today. Minimal twitching noted in upper trap. Pt reports feeling that most of her pain is from swelling due to living in a house with mold.    PT Treatment/Interventions  ADLs/Self Care Home Management;Cryotherapy;Electrical Stimulation;Moist Heat;Traction;Therapeutic activities;Therapeutic exercise;Neuromuscular re-education;Patient/family education;Manual techniques;Passive range of motion;Dry needling;Taping    PT Next Visit Plan  DN PRN, upright postural strengthening    PT Home Exercise Plan  Access Code: [email or web address]    Consulted and Agree with Plan of Care  Patient       Patient will benefit from skilled therapeutic intervention in order to improve the following deficits and impairments:  Pain, Postural dysfunction, Impaired flexibility, Decreased strength, Decreased range of motion, Increased muscle spasms  Visit Diagnosis: Cervicalgia  Chronic bilateral low back pain, unspecified whether sciatica present  Muscle weakness (generalized)  Abnormal posture     Problem List Patient Active Problem List   Diagnosis Date Noted  . Neck pain 03/20/2018  . Obesity, unspecified 09/10/2013    Tamara Hicks C. Talia Hoheisel PT, DPT 05/16/19 5:58 PM   [facility] [address] [place], [place], [address] Phone: [phone]   Fax:  [phone]  Name: Tamara Hicks MRN: [id number] Date of Birth: 05-26-1971

## 2019-05-21 ENCOUNTER — Ambulatory Visit: Payer: Medicaid Other | Admitting: Physical Therapy

## 2019-06-04 ENCOUNTER — Ambulatory Visit: Payer: Medicaid Other | Attending: Neurology | Admitting: Physical Therapy

## 2019-06-04 ENCOUNTER — Other Ambulatory Visit: Payer: Self-pay

## 2019-06-04 DIAGNOSIS — R293 Abnormal posture: Secondary | ICD-10-CM | POA: Diagnosis present

## 2019-06-04 DIAGNOSIS — M542 Cervicalgia: Secondary | ICD-10-CM | POA: Diagnosis present

## 2019-06-04 DIAGNOSIS — G8929 Other chronic pain: Secondary | ICD-10-CM | POA: Diagnosis present

## 2019-06-04 DIAGNOSIS — M545 Low back pain, unspecified: Secondary | ICD-10-CM

## 2019-06-04 DIAGNOSIS — M6281 Muscle weakness (generalized): Secondary | ICD-10-CM | POA: Diagnosis present

## 2019-06-04 NOTE — Therapy (Signed)
[place], [place], [address] Phone: [phone]   Fax:  [phone]  Physical Therapy Treatment  Patient Details  Name: Tamara Hicks MRN: [id number] Date of Birth: May 26, 1971 Referring Provider (PT): Suzzanne Cloud, NP   Encounter Date: 06/04/2019  PT End of Session - 06/04/19 1224    Visit Number  6    Number of Visits  12    Date for PT Re-Evaluation  07/03/19    Authorization Type  MCAID    Authorization Time Period  8/24-10/18    Authorization - Visit Number  2    Authorization - Number of Visits  8    PT Start Time  6712    PT Stop Time  1255    PT Time Calculation (min)  40 min    Activity Tolerance  Patient tolerated treatment well    Behavior During Therapy  [facility] for tasks assessed/performed       Past Medical History:  Diagnosis Date  . Colon polyp   . Hypothyroidism   . Obesity   . Peptic ulcer disease    Gastric ulcer  . Vitamin D deficiency     Past Surgical History:  Procedure Laterality Date  . CESAREAN SECTION    . Renal calculi resection      There were no vitals filed for this visit.  Subjective Assessment - 06/04/19 1222    Subjective  My neck is really bothering me much today. The DN and stretching are helping. Denies pain at this moment    Pain Onset  More than a month ago                       [facility] Adult PT Treatment/Exercise - 06/04/19 0001      Neck Exercises: Machines for Strengthening   UBE (Upper Arm Bike)  2 min fwd/2 min backward      Neck Exercises: Theraband   Other Theraband Exercises  rows and extensions with red X 20, IR/ER with yellow X 20 on Lt shoulder, mid trap with yellow X 15, bilat ER/lower trap X 15      Neck Exercises: Seated   Neck Retraction  20 reps    Cervical Rotation  15 reps    Cervical Rotation Limitations  AAROM with towel    Other Seated Exercise  neck extensions with towel MWM X 15, thoracic extension over  foam roll X 15      Shoulder Exercises: Stretch   Star Gazer Stretch  5 reps   seated in chair     Manual Therapy   Soft tissue mobilization  STM/IASTM to upper traps, mid trap, C-T spine paraspinals               PT Short Term Goals - 05/06/19 1317      PT SHORT TERM GOAL #1   Title  ind with initial HEP    Status  Achieved        PT Long Term Goals - 05/06/19 1240      PT LONG TERM GOAL #1   Title  Pt will report 50% less pain    Baseline  neck and shoulder 60% better      PT LONG TERM GOAL #2   Title  Pt will improve cervical rotation to 60 degrees bilaterally    Baseline  55 deg bilat    Period  Weeks    Status  On-going  PT LONG TERM GOAL #3   Title  Pt will report 50% less difficulty getting dressed and lifting 5-10lb overhead for improved ability to perform ADLs    Baseline  25%    Status  On-going      PT LONG TERM GOAL #4   Title  ind with HEP for pain management and ongoing maintenance of strength    Baseline  still learning    Status  On-going            Plan - 06/04/19 1259    Clinical Impression Statement  Able to progress posture and strengthening exercises today with good tolerance. Showed her some new mobility exercises today for neck/thoracic ROM. She has a lot of tighntess in her paraspinals and traps was treated with manual therapy for STM/IASTM.    Rehab Potential  Good    PT Frequency  1x / week    PT Duration  12 weeks    PT Treatment/Interventions  ADLs/Self Care Home Management;Cryotherapy;Electrical Stimulation;Moist Heat;Traction;Therapeutic activities;Therapeutic exercise;Neuromuscular re-education;Patient/family education;Manual techniques;Passive range of motion;Dry needling;Taping    PT Next Visit Plan  DN PRN, upright postural strengthening    PT Home Exercise Plan  Access Code: [email or web address]    Consulted and Agree with Plan of Care  Patient       Patient will benefit from skilled therapeutic intervention in order to  improve the following deficits and impairments:  Pain, Postural dysfunction, Impaired flexibility, Decreased strength, Decreased range of motion, Increased muscle spasms  Visit Diagnosis: Cervicalgia  Chronic bilateral low back pain, unspecified whether sciatica present  Muscle weakness (generalized)  Abnormal posture     Problem List Patient Active Problem List   Diagnosis Date Noted  . Neck pain 03/20/2018  . Obesity, unspecified 09/10/2013    Birdie RiddleBrian R ,PT,DPT 06/04/2019, 1:05 PM  [facility] [address] [place], [place], [address] Phone: [phone]   Fax:  [phone]  Name: Tamara Hicks MRN: [id number] Date of Birth: 03/17/1971

## 2019-06-20 ENCOUNTER — Other Ambulatory Visit: Payer: Self-pay

## 2019-06-20 ENCOUNTER — Ambulatory Visit: Payer: Medicaid Other | Attending: Neurology | Admitting: Physical Therapy

## 2019-06-20 ENCOUNTER — Encounter: Payer: Self-pay | Admitting: Physical Therapy

## 2019-06-20 DIAGNOSIS — M6281 Muscle weakness (generalized): Secondary | ICD-10-CM | POA: Insufficient documentation

## 2019-06-20 DIAGNOSIS — R293 Abnormal posture: Secondary | ICD-10-CM | POA: Diagnosis present

## 2019-06-20 DIAGNOSIS — G8929 Other chronic pain: Secondary | ICD-10-CM | POA: Diagnosis present

## 2019-06-20 DIAGNOSIS — M545 Low back pain, unspecified: Secondary | ICD-10-CM

## 2019-06-20 DIAGNOSIS — M542 Cervicalgia: Secondary | ICD-10-CM | POA: Diagnosis present

## 2019-06-20 NOTE — Therapy (Signed)
[facility] [address] [place], [place], [address] Phone: [phone]   Fax:  [phone]  Physical Therapy Treatment  Patient Details  Name: Tamara Hicks MRN: [id number] Date of Birth: [birth year] Referring Provider (PT): Glean Salvo, NP   Encounter Date: 06/20/2019  PT End of Session - 06/20/19 1334    Visit Number  7    Number of Visits  12    Date for PT Re-Evaluation  07/03/19    Authorization Type  MCAID    Authorization Time Period  8/24-10/18    Authorization - Visit Number  3    Authorization - Number of Visits  8    PT Start Time  1334    PT Stop Time  1414    PT Time Calculation (min)  40 min    Activity Tolerance  Patient tolerated treatment well    Behavior During Therapy  [facility] for tasks assessed/performed       Past Medical History:  Diagnosis Date  . Colon polyp   . Hypothyroidism   . Obesity   . Peptic ulcer disease    Gastric ulcer  . Vitamin D deficiency     Past Surgical History:  Procedure Laterality Date  . CESAREAN SECTION    . Renal calculi resection      There were no vitals filed for this visit.  Subjective Assessment - 06/20/19 1336    Subjective  Neck is just tight, when I tip to the Rt I have pain on the right. I didn't have any pain yesterday. Horiz abd makes my neck muscles burn.    Currently in Pain?  Yes    Pain Score  2     Pain Location  Neck    Pain Descriptors / Indicators  Tightness    Aggravating Factors   horiz abd    Pain Relieving Factors  stretches                       [facility] Adult PT Treatment/Exercise - 06/20/19 0001      Shoulder Exercises: Prone   Other Prone Exercises  scap retraction- heavy cuing required      Shoulder Exercises: ROM/Strengthening   UBE (Upper Arm Bike)  retro 3 min L2.5      Shoulder Exercises: Stretch   Other Shoulder Stretches  rotation SNAG      Manual Therapy   Manual Therapy  Manual Traction;Taping;Joint  mobilization    Joint Mobilization  prone rib & thoracic mobs    Soft tissue mobilization  suboccipital release, bil cervical paraspinals, bil upper traps    Manual Traction  cervical    McConnell  bil upper trap inhibition       Trigger Point Dry Needling - 06/20/19 0001    Dry Needling Comments  C4 Rt paraspinals    Upper Trapezius Response  Twitch reponse elicited;Palpable increased muscle length   bilat            PT Short Term Goals - 05/06/19 1317      PT SHORT TERM GOAL #1   Title  ind with initial HEP    Status  Achieved        PT Long Term Goals - 05/06/19 1240      PT LONG TERM GOAL #1   Title  Pt will report 50% less pain    Baseline  neck and shoulder 60% better      PT LONG TERM  GOAL #2   Title  Pt will improve cervical rotation to 60 degrees bilaterally    Baseline  55 deg bilat    Period  Weeks    Status  On-going      PT LONG TERM GOAL #3   Title  Pt will report 50% less difficulty getting dressed and lifting 5-10lb overhead for improved ability to perform ADLs    Baseline  25%    Status  On-going      PT LONG TERM GOAL #4   Title  ind with HEP for pain management and ongoing maintenance of strength    Baseline  still learning    Status  On-going            Plan - 06/20/19 1414    Clinical Impression Statement  Concordant pain in bilateral trigger points- greater twitch noted in Rt than Lt. REsts with very rounded shoulders creating compression in cervical spine. asked her to move frequently to reduce soreness and mcconnell tape placed on upper traps for cues.    PT Treatment/Interventions  ADLs/Self Care Home Management;Cryotherapy;Electrical Stimulation;Moist Heat;Traction;Therapeutic activities;Therapeutic exercise;Neuromuscular re-education;Patient/family education;Manual techniques;Passive range of motion;Dry needling;Taping    PT Next Visit Plan  continue DN PRN, progress prone strength    PT Home Exercise Plan  upper trap stretch,  shoulder rolls, row, triceps press    Consulted and Agree with Plan of Care  Patient       Patient will benefit from skilled therapeutic intervention in order to improve the following deficits and impairments:  Pain, Postural dysfunction, Impaired flexibility, Decreased strength, Decreased range of motion, Increased muscle spasms  Visit Diagnosis: Cervicalgia  Chronic bilateral low back pain, unspecified whether sciatica present  Muscle weakness (generalized)  Abnormal posture     Problem List Patient Active Problem List   Diagnosis Date Noted  . Neck pain 03/20/2018  . Obesity, unspecified 09/10/2013    Abrar Koone C. Kelen Laura PT, DPT 06/20/19 2:18 PM   [facility] [address] [place], [place], [address] Phone: [phone]   Fax:  [phone]  Name: Tamara Hicks MRN: [id number] Date of Birth: 12/30/1970

## 2019-07-03 ENCOUNTER — Ambulatory Visit: Payer: Medicaid Other | Admitting: Physical Therapy

## 2019-07-03 ENCOUNTER — Other Ambulatory Visit: Payer: Self-pay

## 2019-07-03 DIAGNOSIS — M542 Cervicalgia: Secondary | ICD-10-CM

## 2019-07-03 DIAGNOSIS — G8929 Other chronic pain: Secondary | ICD-10-CM

## 2019-07-03 DIAGNOSIS — M6281 Muscle weakness (generalized): Secondary | ICD-10-CM

## 2019-07-03 DIAGNOSIS — R293 Abnormal posture: Secondary | ICD-10-CM

## 2019-07-03 NOTE — Therapy (Signed)
[facility] [address] [place], [place], [address] Phone: [phone]   Fax:  [phone]  Physical Therapy Treatment  Patient Details  Name: Tamara Hicks MRN: [id number] Date of Birth: [birth year] Referring Provider (PT): Glean Salvo, NP   Encounter Date: 07/03/2019  PT End of Session - 07/03/19 1246    Visit Number  8    Number of Visits  12    Date for PT Re-Evaluation  07/03/19    Authorization Type  MCA[employer]    Authorization Time Period  8/24-10/18    Authorization - Visit Number  4    Authorization - Number of Visits  8    PT Start Time  1210    PT Stop Time  1250    PT Time Calculation (min)  40 min    Activity Tolerance  Patient tolerated treatment well    Behavior During Therapy  [facility] for tasks assessed/performed       Past Medical History:  Diagnosis Date  . Colon polyp   . Hypothyroidism   . Obesity   . Peptic ulcer disease    Gastric ulcer  . Vitamin D deficiency     Past Surgical History:  Procedure Laterality Date  . CESAREAN SECTION    . Renal calculi resection      There were no vitals filed for this visit.  Subjective Assessment - 07/03/19 1249    Subjective  Pain only about a 1 or  today in my neck.    Pain Onset  More than a month ago                       [facility] Adult PT Treatment/Exercise - 07/03/19 0001      Neck Exercises: Theraband   Other Theraband Exercises  rows and extensions with red X 20,  mid trap with yellow X 15, bilat ER/lower trap X 15, standing Y with yellow X 15      Neck Exercises: Seated   Other Seated Exercise  neck extension and rotations SNAG X 10 reps ea      Neck Exercises: Supine   Neck Retraction  20 reps      Shoulder Exercises: ROM/Strengthening   UBE (Upper Arm Bike)  5 min total 2.5 min fwd and 2.5 min retro      Manual Therapy   Manual therapy comments  manual cervical traction, SO release, neck PROM for side bending and  rotation, C-T spine central PA mobs grade 1-2.               PT Short Term Goals - 05/06/19 1317      PT SHORT TERM GOAL #1   Title  ind with initial HEP    Status  Achieved        PT Long Term Goals - 05/06/19 1240      PT LONG TERM GOAL #1   Title  Pt will report 50% less pain    Baseline  neck and shoulder 60% better      PT LONG TERM GOAL #2   Title  Pt will improve cervical rotation to 60 degrees bilaterally    Baseline  55 deg bilat    Period  Weeks    Status  On-going      PT LONG TERM GOAL #3   Title  Pt will report 50% less difficulty getting dressed and lifting 5-10lb overhead for improved ability to perform ADLs  Baseline  25%    Status  On-going      PT LONG TERM GOAL #4   Title  ind with HEP for pain management and ongoing maintenance of strength    Baseline  still learning    Status  On-going            Plan - 07/03/19 1247    Clinical Impression Statement  Overall had less pain today and able to progress her strenghtening some without complaints. She will need recert/reauthorizaton next visit.    PT Treatment/Interventions  ADLs/Self Care Home Management;Cryotherapy;Electrical Stimulation;Moist Heat;Traction;Therapeutic activities;Therapeutic exercise;Neuromuscular re-education;Patient/family education;Manual techniques;Passive range of motion;Dry needling;Taping    PT Next Visit Plan  continue DN PRN, progress prone strength    PT Home Exercise Plan  upper trap stretch, shoulder rolls, row, triceps press    Consulted and Agree with Plan of Care  Patient       Patient will benefit from skilled therapeutic intervention in order to improve the following deficits and impairments:  Pain, Postural dysfunction, Impaired flexibility, Decreased strength, Decreased range of motion, Increased muscle spasms  Visit Diagnosis: Cervicalgia  Chronic bilateral low back pain, unspecified whether sciatica present  Muscle weakness  (generalized)  Abnormal posture     Problem List Patient Active Problem List   Diagnosis Date Noted  . Neck pain 03/20/2018  . Obesity, unspecified 09/10/2013    Tamara Hicks 07/03/2019, 12:49 PM  [facility] [address] [place], [place], [address] Phone: [phone]   Fax:  [phone]  Name: Tamara Hicks MRN: [id number] Date of Birth: 20-Jun-1971

## 2019-07-11 ENCOUNTER — Encounter: Payer: Self-pay | Admitting: Physical Therapy

## 2019-07-11 ENCOUNTER — Ambulatory Visit: Payer: Medicaid Other | Admitting: Physical Therapy

## 2019-07-11 ENCOUNTER — Other Ambulatory Visit: Payer: Self-pay

## 2019-07-11 DIAGNOSIS — M6281 Muscle weakness (generalized): Secondary | ICD-10-CM

## 2019-07-11 DIAGNOSIS — R293 Abnormal posture: Secondary | ICD-10-CM

## 2019-07-11 DIAGNOSIS — M542 Cervicalgia: Secondary | ICD-10-CM

## 2019-07-11 NOTE — Therapy (Addendum)
[place], [place], [address] Phone: [phone]   Fax:  [phone]  Physical Therapy Treatment  Patient Details  Name: Tamara Hicks MRN: [id number] Date of Birth: 1970/11/05 Referring Provider (PT): Suzzanne Cloud, NP   Encounter Date: 07/11/2019  PT End of Session - 07/11/19 1321    Visit Number  9    Number of Visits  12    Date for PT Re-Evaluation  09/01/19    Authorization Type  MCAID    Authorization Time Period  10/19-12/13    Authorization - Visit Number  1    Authorization - Number of Visits  8    PT Start Time  1219    PT Stop Time  1258    PT Time Calculation (min)  39 min    Activity Tolerance  Patient tolerated treatment well    Behavior During Therapy  [facility] for tasks assessed/performed       Past Medical History:  Diagnosis Date  . Colon polyp   . Hypothyroidism   . Obesity   . Peptic ulcer disease    Gastric ulcer  . Vitamin D deficiency     Past Surgical History:  Procedure Laterality Date  . CESAREAN SECTION    . Renal calculi resection      There were no vitals filed for this visit.  Subjective Assessment - 07/11/19 1224    Subjective  Patient reports neck tightness on both sides. She states that every day is different for her and today she is just tight.    Currently in Pain?  Yes    Pain Score  2     Pain Location  Neck   upper trap   Pain Orientation  Lower;Right;Left    Pain Descriptors / Indicators  Tightness    Pain Type  Chronic pain    Pain Onset  More than a month ago    Pain Frequency  Intermittent                       [facility] Adult PT Treatment/Exercise - 07/11/19 0001      Exercises   Exercises  Neck      Neck Exercises: Theraband   Shoulder Extension  10 reps;Red   2 sets   Shoulder Extension Limitations  cued for scpaular retraction/depression to avoid shrug    Rows  10 reps;Red   2 sets   Rows Limitations  cued for  scpaular retraction/depression to avoid shrug    Horizontal ABduction  10 reps;Red   2 sets     Neck Exercises: Prone   Other Prone Exercise  Prone I 2x10 with cues for scpaular retraction/depression to avoid shrug      Manual Therapy   Manual Therapy  Manual Traction;Soft tissue mobilization;Passive ROM    Soft tissue mobilization  suboccipital release    Passive ROM  upper trap stretch    Manual Traction  cervical      Neck Exercises: Stretches   Upper Trapezius Stretch  20 seconds;Right;Left    Levator Stretch  20 seconds;Right;Left    Other Neck Stretches  Side lying thoracic rotation open book stretch x10 5 sec each    Other Neck Stretches  Child's pose stretch 3x20 sec             PT Education - 07/11/19 1320    Education Details  HEP, posture and keeping shoulders/neck relaxed    Person(s) Educated  Patient    Methods  Explanation;Demonstration;Verbal cues;Handout    Comprehension  Verbalized understanding;Verbal cues required;Need further instruction       PT Short Term Goals - 05/06/19 1317      PT SHORT TERM GOAL #1   Title  ind with initial HEP    Status  Achieved        PT Long Term Goals - 05/06/19 1240      PT LONG TERM GOAL #1   Title  Pt will report 50% less pain    Baseline  neck and shoulder 60% better      PT LONG TERM GOAL #2   Title  Pt will improve cervical rotation to 60 degrees bilaterally    Baseline  55 deg bilat    Period  Weeks    Status  On-going      PT LONG TERM GOAL #3   Title  Pt will report 50% less difficulty getting dressed and lifting 5-10lb overhead for improved ability to perform ADLs    Baseline  25%    Status  On-going      PT LONG TERM GOAL #4   Title  ind with HEP for pain management and ongoing maintenance of strength    Baseline  still learning    Status  On-going            Plan - 07/11/19 1327    Clinical Impression Statement  Patient tolerated therapy well with no adverse effects. She reports  continued tightness of upper trap and upper back region. She reported benefit from manual therapy and stretching, no increased pain noted with addition of new postural strengthening exercises. She stated that she feels the dry needling has benefited hr the most she will be schedule for future sessions of dry needling.    PT Treatment/Interventions  ADLs/Self Care Home Management;Cryotherapy;Electrical Stimulation;Moist Heat;Traction;Therapeutic activities;Therapeutic exercise;Neuromuscular re-education;Patient/family education;Manual techniques;Passive range of motion;Dry needling;Taping    PT Next Visit Plan  continue DN and manual therapy PRN, progress postural strength and control with focus on reducing shrug    PT Home Exercise Plan  upper trap and levator stretch, child's pose stretch, side lying open book stretch, prone I, row with red band, tricep press with red band, standing T with red band    Consulted and Agree with Plan of Care  Patient       Patient will benefit from skilled therapeutic intervention in order to improve the following deficits and impairments:  Pain, Postural dysfunction, Impaired flexibility, Decreased strength, Decreased range of motion, Increased muscle spasms  Visit Diagnosis: Cervicalgia  Muscle weakness (generalized)  Abnormal posture     Problem List Patient Active Problem List   Diagnosis Date Noted  . Neck pain 03/20/2018  . Obesity, unspecified 09/10/2013    Hilda Blades, PT, DPT, LAT, ATC 07/11/19  1:34 PM Phone: [phone] Fax: [facility] [address] [place], [place], [address] Phone: [phone]   Fax:  [phone]  Name: Tamara Hicks MRN: [id number] Date of Birth: 27-Jan-1971     PHYSICAL THERAPY DISCHARGE SUMMARY  Visits from Start of Care: 9  Current functional level related to goals / functional outcomes: Patient still having pain and tightness of  neck   Remaining deficits: Pain and tightness   Education / Equipment: Anatomy of condition, HEP, activity modifications  Plan: Patient agrees to discharge.  Patient goals were not met. Patient is being discharged due to not returning since the last  visit.  ?????    Hilda Blades, PT, DPT, LAT, ATC 08/22/19  1:37 PM Phone: [phone] Fax: [phone]

## 2019-07-11 NOTE — Patient Instructions (Signed)
Access Code: [email or web address]  URL: [email or web address]  Date: 07/11/2019  Prepared by: Hilda Blades   Exercises Child's Pose Stretch - 3 reps - 20-30 seconds hold Sidelying Thoracic Lumbar Rotation - 10 reps - 5 seconds hold Prone Scapular Slide with Shoulder Extension - 10 reps - 2 sets Standing Shoulder Horizontal Abduction with Resistance - 10 reps - 2 sets

## 2019-10-09 ENCOUNTER — Ambulatory Visit: Payer: Medicaid Other | Admitting: Neurology

## 2019-10-09 ENCOUNTER — Other Ambulatory Visit: Payer: Self-pay

## 2019-10-09 ENCOUNTER — Encounter: Payer: Self-pay | Admitting: Neurology

## 2019-10-09 VITALS — BP 140/88 | HR 111 | Temp 97.5°F | Ht 63.0 in | Wt 245.4 lb

## 2019-10-09 DIAGNOSIS — M542 Cervicalgia: Secondary | ICD-10-CM | POA: Diagnosis not present

## 2019-10-09 MED ORDER — CYCLOBENZAPRINE HCL 5 MG PO TABS: 5.0000 mg | ORAL_TABLET | Freq: Three times a day (TID) | ORAL | 3 refills | Status: DC | PRN

## 2019-10-09 NOTE — Patient Instructions (Addendum)
Take the Flexeril as needed for neck pain, check therapy with see if any more sessions, do neck exercises/stretches at home everyday, try to incorporate consistent exercise

## 2019-10-09 NOTE — Progress Notes (Signed)
I have read the note, and I agree with the clinical assessment and plan.  Charles K Willis   
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
## 2020-06-04 ENCOUNTER — Other Ambulatory Visit: Payer: Self-pay | Admitting: Neurology

## 2020-11-26 ENCOUNTER — Other Ambulatory Visit: Payer: Self-pay | Admitting: Neurology

## 2021-01-18 DIAGNOSIS — H66001 Acute suppurative otitis media without spontaneous rupture of ear drum, right ear: Secondary | ICD-10-CM | POA: Insufficient documentation

## 2021-03-04 DIAGNOSIS — Z9109 Other allergy status, other than to drugs and biological substances: Secondary | ICD-10-CM | POA: Insufficient documentation

## 2021-03-04 DIAGNOSIS — H6991 Unspecified Eustachian tube disorder, right ear: Secondary | ICD-10-CM | POA: Insufficient documentation

## 2023-07-12 ENCOUNTER — Other Ambulatory Visit: Payer: Self-pay | Admitting: Physician Assistant

## 2023-07-12 DIAGNOSIS — J309 Allergic rhinitis, unspecified: Secondary | ICD-10-CM | POA: Insufficient documentation

## 2023-07-12 DIAGNOSIS — Z8639 Personal history of other endocrine, nutritional and metabolic disease: Secondary | ICD-10-CM

## 2023-07-12 DIAGNOSIS — H93291 Other abnormal auditory perceptions, right ear: Secondary | ICD-10-CM | POA: Insufficient documentation

## 2023-09-19 ENCOUNTER — Ambulatory Visit: Payer: Medicaid Other | Admitting: Internal Medicine

## 2023-09-22 ENCOUNTER — Other Ambulatory Visit: Payer: Self-pay

## 2023-09-22 ENCOUNTER — Encounter: Payer: Self-pay | Admitting: Internal Medicine

## 2023-09-22 ENCOUNTER — Ambulatory Visit (INDEPENDENT_AMBULATORY_CARE_PROVIDER_SITE_OTHER): Payer: Medicaid Other | Admitting: Internal Medicine

## 2023-09-22 VITALS — BP 120/86 | HR 88 | Temp 98.1°F | Ht 63.07 in | Wt 227.2 lb

## 2023-09-22 DIAGNOSIS — K9049 Malabsorption due to intolerance, not elsewhere classified: Secondary | ICD-10-CM

## 2023-09-22 DIAGNOSIS — J3089 Other allergic rhinitis: Secondary | ICD-10-CM | POA: Diagnosis not present

## 2023-09-22 MED ORDER — CETIRIZINE HCL 10 MG PO TABS: 10.0000 mg | ORAL_TABLET | Freq: Every day | ORAL | 5 refills | Status: AC

## 2023-09-22 MED ORDER — FLUTICASONE PROPIONATE 50 MCG/ACT NA SUSP: 2.0000 | Freq: Every day | NASAL | 5 refills | Status: AC

## 2023-09-22 NOTE — Progress Notes (Signed)
 NEW PATIENT  Date of Service/Encounter:  09/22/23  Consult requested by: Waddell Palma, PA-C   Subjective:   Tamara Hicks (DOB: 1971-06-28) is a 53 y.o. female who presents to the clinic on 09/22/2023 with a chief complaint of Allergies (Lots of ear infection), Eczema, and Angioedema .    History obtained from: chart review and patient.   Rhinitis:  Started around 2005.  Symptoms include: nasal congestion, post nasal drainage, sneezing, and itchy eyes ear infections and hearing problems Has received about 2-3 courses of antibiotics for ear infections and conjunctivitis.    Occurs seasonally-Winter Potential triggers: mold   Treatments tried:  Zyrtec  daily  Flonase  PRN   Previous allergy  testing: no History of sinus surgery: no Nonallergic triggers: none   Food Reactions: Reports dairy and chicken causing full body swelling and weight gain.  Used to see a homeopathic medicine doctor who did food testing that showed sensitivity to those through bloodwork.  Also reports symptoms started after mold exposure.   Reviewed:  07/12/2023: seen by [place] PA with [facility] for nasal congestion and ear problems.  Discussed likely allergic rhinitis, started on Flonase  and anti histamine daily.   08/03/2023: seen by [facility] for tooth decay; underwent crown that fractured so was seen for replacement.   07/12/2023: audiology exam:  Normal hearing and normal eardrum mobility, bilaterally.   05/16/2023: seen in urgent care for bacterial infection of R middle ears, given augmentin.   Past Medical History: Past Medical History:  Diagnosis Date   Colon polyp    Hypothyroidism    Obesity    Peptic ulcer disease    Gastric ulcer   Vitamin D deficiency    Past Surgical History: Past Surgical History:  Procedure Laterality Date   CESAREAN SECTION     Renal calculi resection      Family History: Family History  Problem Relation Age of Onset   Hypertension Mother     Asthma Father    Rheum arthritis Father    Heart disease Father    Pneumonia Father    Asthma Brother     Social History:  Flooring in bedroom: engineer, civil (consulting) Pets: cats and dogs  Tobacco use/exposure: [phone], 1ppd 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 If you have any questions, ask your nurse or doctor.          buPROPion 300 MG 24 hr tablet Commonly known as: WELLBUTRIN XL Take 300 mg by mouth daily.   cetirizine  10 MG tablet Commonly known as: ZYRTEC  Take 10 mg by mouth daily.   cholecalciferol 25 MCG (1000 UNIT) tablet Commonly known as: VITAMIN D3 Take 1 tablet by mouth daily.   cyclobenzaprine  5 MG tablet Commonly known as: FLEXERIL  Take 1 tablet by mouth three times daily as needed for muscle spasm   Dexilant 60 MG capsule Generic drug: dexlansoprazole Take 60 mg by mouth daily.   fluticasone  50 MCG/ACT nasal spray Commonly known as: FLONASE  Place 2 sprays into both nostrils daily.   hydrochlorothiazide 12.5 MG tablet Commonly known as: HYDRODIURIL Take 12.5 mg by mouth daily.   levothyroxine  50 MCG tablet Commonly known as: SYNTHROID  Take 50 mcg by mouth daily before breakfast.   LORazepam  0.5 MG tablet Commonly known  as: ATIVAN  Take 0.5 mg by mouth as needed.   losartan  25 MG tablet Commonly known as: COZAAR  Take 25 mg by mouth daily.   meloxicam 15 MG tablet Commonly known as: MOBIC Take 15 mg by mouth daily.   mesalamine  1.2 g EC tablet Commonly known as: LIALDA  Take 1.2 g by mouth daily with breakfast.   ondansetron  4 MG disintegrating tablet Commonly known as: ZOFRAN -ODT Take 4 mg by mouth as needed.   Patanol 0.1 % ophthalmic solution Generic drug: olopatadine   propranolol 10 MG tablet Commonly known as: INDERAL Take 10 mg by mouth as  needed.   topiramate 50 MG tablet Commonly known as: TOPAMAX Take 50 mg by mouth daily.   torsemide  10 MG tablet Commonly known as: DEMADEX  Take 10 mg by mouth daily.         REVIEW OF SYSTEMS: Pertinent positives and negatives discussed in HPI.   Objective:   Physical Exam: BP 120/86 (BP Location: Left Arm, Patient Position: Sitting, Cuff Size: Large)   Pulse 88   Temp 98.1 F (36.7 C) (Temporal)   Ht 5' 3.07 (1.602 m)   Wt 227 lb 3.2 oz (103.1 kg)   SpO2 97%   BMI 40.16 kg/m  Body mass index is 40.16 kg/m. GEN: alert, well developed HEENT: clear conjunctiva, nose with + mild inferior turbinate hypertrophy, pink nasal mucosa, slight clear rhinorrhea, no cobblestoning HEART: regular rate and rhythm, no murmur LUNGS: clear to auscultation bilaterally, no coughing, unlabored respiration ABDOMEN: soft, non distended  SKIN: no rashes or lesions  Assessment:   1. Other allergic rhinitis   2. Food intolerance     Plan/Recommendations:  Other Allergic Rhinitis: - Due to turbinate hypertrophy, seasonal symptoms and unresponsive to over the counter meds, will perform skin testing to identify aeroallergen triggers.   - Use nasal saline rinses before nose sprays such as with Neilmed Sinus Rinse.  Use distilled water.   - Use Flonase  2 sprays each nostril daily. Aim upward and outward. - Use Zyrtec  10 mg daily.  - Hold all anti-histamines (Xyzal, Allegra, Zyrtec , Claritin, Benadryl ) 3 days prior to next visit.    Food Reactions:  - Discussed foods causing generalized body swelling/weight gain and are not allergies.  Mold exposure has also not proven to cause this.  Avoid foods such as dairy/chicken that trigger symptoms.  Discussed we only do IgE mediated food testing which she does not need.     Follow up: 1/10 at 1:30 PM for skin testing 1-55, IDs okay    Arleta Blanch, MD [facility] 

## 2023-09-22 NOTE — Patient Instructions (Addendum)
 Other Allergic Rhinitis: - Use nasal saline rinses before nose sprays such as with Neilmed Sinus Rinse.  Use distilled water.   - Use Flonase  2 sprays each nostril daily. Aim upward and outward. - Use Zyrtec  10 mg daily.  - Hold all anti-histamines (Xyzal, Allegra, Zyrtec , Claritin, Benadryl ) 3 days prior to next visit.   Food Reactions:  - Discussed foods causing generalized body swelling/weight gain and are not allergies.  Mold exposure has also not proven to cause this.  Avoid foods such as dairy/chicken that trigger symptoms.  Discussed we only do IgE mediated food testing which she does not need.     Follow up: 1/10 at 1:30 PM for skin testing 1-55

## 2023-09-29 ENCOUNTER — Ambulatory Visit: Payer: Medicaid Other | Admitting: Internal Medicine

## 2023-10-18 ENCOUNTER — Ambulatory Visit (INDEPENDENT_AMBULATORY_CARE_PROVIDER_SITE_OTHER): Payer: Medicaid Other | Admitting: Internal Medicine

## 2023-10-18 DIAGNOSIS — J301 Allergic rhinitis due to pollen: Secondary | ICD-10-CM | POA: Diagnosis not present

## 2023-10-18 MED ORDER — AZELASTINE HCL 0.1 % NA SOLN: 2.0000 | Freq: Two times a day (BID) | NASAL | 5 refills | Status: DC | PRN

## 2023-10-18 NOTE — Progress Notes (Signed)
FOLLOW UP Date of Service/Encounter:  10/18/23   Subjective:  Tamara Hicks (DOB: 1971-03-14) is a 53 y.o. female [facility] follow up for skin testing.   History obtained from: chart review and patient.  Anti histamines held.  Reports surprise at negative mold testing.  Has lived in a moldy house in the past and had severe lethargy/fatigue/couldn't get out of bed/body swelling/weight gain.   Past Medical History: Past Medical History:  Diagnosis Date   Colon polyp    Hypothyroidism    Obesity    Peptic ulcer disease    Gastric ulcer   Vitamin D deficiency     Objective:  There were no vitals taken for this visit. There is no height or weight on file to calculate BMI. Physical Exam: GEN: alert, well developed HEENT: clear conjunctiva, MMM LUNGS: unlabored respiration  Skin Testing:  Skin prick testing was placed, which includes aeroallergens/foods, histamine control, and saline control.  Verbal consent was obtained prior to placing test.  Patient tolerated procedure well.  Allergy testing results were read and interpreted by myself, documented by clinical staff. Adequate positive and negative control.  Positive results to:  Results discussed with patient/family.  Airborne Adult Perc - 10/18/23 1354     Time Antigen Placed 1354    Allergen Manufacturer Waynette Buttery    Location Back    Number of Test 55    1. Control-Buffer 50% Glycerol Negative    2. Control-Histamine 3+    3. Bahia Negative    4. French [place] Negative    5. Johnson Negative    6. Kentucky Blue Negative    7. Meadow Fescue Negative    8. Perennial Rye Negative    9. Timothy Negative    10. Ragweed Mix Negative    11. Cocklebur Negative    12. Plantain,  English Negative    13. Baccharis Negative    14. Dog Fennel Negative    15. Russian Thistle Negative    16. Lamb's Quarters Negative    17. Sheep Sorrell Negative    18. Rough Pigweed Negative     19. Marsh Elder, Rough Negative    20. Mugwort, Common Negative    21. Box, Elder Negative    22. Cedar, red 2+    23. Sweet Gum 3+    24. Pecan Pollen 3+    25. Pine Mix 3+    26. Walnut, Black Pollen Negative    27. Red Mulberry Negative    28. Ash Mix Negative    29. Birch Mix Negative    30. Beech American Negative    31. Cottonwood, Guinea[place] Negative    32. Hickory, White Negative    33. Maple Mix Negative    34. Oak, [place] Mix Negative    35. Sycamore Eastern Negative    36. Alternaria Alternata Negative    37. Cladosporium Herbarum Negative    38. Aspergillus Mix Negative    39. Penicillium Mix Negative    40. Bipolaris Sorokiniana (Helminthosporium) Negative    41. Drechslera Spicifera (Curvularia) Negative    42. Mucor Plumbeus Negative    43. Fusarium Moniliforme Negative    44. Aureobasidium Pullulans (pullulara) Negative    45. Rhizopus Oryzae Negative    46. Botrytis Cinera Negative    47. Epicoccum Nigrum Negative    48. Phoma Betae Negative    49. Dust Mite Mix Negative    50. Cat Hair 10,000 BAU/ml  Negative    51.  Dog Epithelia Negative    52. Mixed Feathers Negative    53. Horse Epithelia Negative    54. Cockroach, German Negative    55. Tobacco Leaf Negative              Assessment:   1. Seasonal allergic rhinitis due to pollen     Plan/Recommendations:  Allergic Rhinitis: - Due to turbinate hypertrophy, seasonal symptoms and unresponsive to over the counter meds, will perform skin testing to identify aeroallergen triggers.  - SPT 09/2023: positive to trees  - Avoidance measures discussed. - Use nasal saline rinses before nose sprays such as with Neilmed Sinus Rinse.  Use distilled water.   - Use Flonase 2 sprays each nostril daily. Aim upward and outward. - Use Azelastine 1-2 sprays each nostril twice daily as needed for runny nose, drainage, sneezing, congestion. Aim upward and outward. - Use Zyrtec 10 mg daily.   Food/Mold  Reactions:  - Discussed foods causing generalized body swelling/weight gain/lethargy and are not allergies.  Mold exposure has also not proven to cause this.  Avoid foods such as dairy/chicken that trigger symptoms.     ALLERGEN AVOIDANCE MEASURES   Pollen Avoidance Pollen levels are highest during the mid-day and afternoon.  Consider this when planning outdoor activities. Avoid being outside when the grass is being mowed, or wear a mask if the pollen-allergic person must be the one to mow the grass. Keep the windows closed to keep pollen outside of the home. Use an air conditioner to filter the air. Take a shower, wash hair, and change clothing after working or playing outdoors during pollen season.      Return in about 4 months (around 02/15/2024).  Alesia Morin, MD [facility]

## 2023-10-18 NOTE — Patient Instructions (Addendum)
Allergic Rhinitis: - SPT 09/2023: positive to trees  - Avoidance measures discussed. - Use nasal saline rinses before nose sprays such as with Neilmed Sinus Rinse.  Use distilled water.   - Use Flonase 2 sprays each nostril daily. Aim upward and outward. - Use Azelastine 1-2 sprays each nostril twice daily as needed for runny nose, drainage, sneezing, congestion. Aim upward and outward. - Use Zyrtec 10 mg daily.    ALLERGEN AVOIDANCE MEASURES   Pollen Avoidance Pollen levels are highest during the mid-day and afternoon.  Consider this when planning outdoor activities. Avoid being outside when the grass is being mowed, or wear a mask if the pollen-allergic person must be the one to mow the grass. Keep the windows closed to keep pollen outside of the home. Use an air conditioner to filter the air. Take a shower, wash hair, and change clothing after working or playing outdoors during pollen season.

## 2024-01-25 ENCOUNTER — Other Ambulatory Visit: Payer: Self-pay

## 2024-01-25 ENCOUNTER — Emergency Department (HOSPITAL_COMMUNITY)

## 2024-01-25 ENCOUNTER — Emergency Department (HOSPITAL_COMMUNITY)
Admission: EM | Admit: 2024-01-25 | Discharge: 2024-01-25 | Disposition: A | Discharge status: Home / Self Care | Attending: Emergency Medicine | Admitting: Emergency Medicine

## 2024-01-25 ENCOUNTER — Encounter (HOSPITAL_COMMUNITY): Payer: Self-pay

## 2024-01-25 DIAGNOSIS — R109 Unspecified abdominal pain: Secondary | ICD-10-CM | POA: Insufficient documentation

## 2024-01-25 DIAGNOSIS — M25561 Pain in right knee: Secondary | ICD-10-CM | POA: Insufficient documentation

## 2024-01-25 DIAGNOSIS — R079 Chest pain, unspecified: Secondary | ICD-10-CM | POA: Diagnosis not present

## 2024-01-25 DIAGNOSIS — M546 Pain in thoracic spine: Secondary | ICD-10-CM | POA: Diagnosis present

## 2024-01-25 DIAGNOSIS — S29012A Strain of muscle and tendon of back wall of thorax, initial encounter: Secondary | ICD-10-CM | POA: Diagnosis not present

## 2024-01-25 DIAGNOSIS — M25512 Pain in left shoulder: Secondary | ICD-10-CM | POA: Diagnosis not present

## 2024-01-25 DIAGNOSIS — S7012XA Contusion of left thigh, initial encounter: Secondary | ICD-10-CM | POA: Insufficient documentation

## 2024-01-25 DIAGNOSIS — M542 Cervicalgia: Secondary | ICD-10-CM | POA: Diagnosis not present

## 2024-01-25 DIAGNOSIS — S7002XA Contusion of left hip, initial encounter: Secondary | ICD-10-CM | POA: Insufficient documentation

## 2024-01-25 DIAGNOSIS — E039 Hypothyroidism, unspecified: Secondary | ICD-10-CM | POA: Insufficient documentation

## 2024-01-25 DIAGNOSIS — Y92410 Unspecified street and highway as the place of occurrence of the external cause: Secondary | ICD-10-CM | POA: Diagnosis not present

## 2024-01-25 DIAGNOSIS — S29019A Strain of muscle and tendon of unspecified wall of thorax, initial encounter: Secondary | ICD-10-CM

## 2024-01-25 LAB — COMPREHENSIVE METABOLIC PANEL WITH GFR
ALT: 17 U/L (ref 0–44)
AST: 23 U/L (ref 15–41)
Albumin: 3.6 g/dL (ref 3.5–5.0)
Alkaline Phosphatase: 63 U/L (ref 38–126)
Anion gap: 10 (ref 5–15)
BUN: 13 mg/dL (ref 6–20)
CO2: 23 mmol/L (ref 22–32)
Calcium: 9.1 mg/dL (ref 8.9–10.3)
Chloride: 104 mmol/L (ref 98–111)
Creatinine, Ser: 0.9 mg/dL (ref 0.44–1.00)
GFR, Estimated: >60 mL/min (ref >60)
Glucose, Bld: 90 mg/dL (ref 70–99)
Potassium: 3.7 mmol/L (ref 3.5–5.1)
Sodium: 137 mmol/L (ref 135–145)
Total Bilirubin: 1.3 mg/dL — ABNORMAL HIGH (ref 0.0–1.2)
Total Protein: 5.9 g/dL — ABNORMAL LOW (ref 6.5–8.1)

## 2024-01-25 LAB — CBC
HCT: 40 % (ref 36.0–46.0)
Hemoglobin: 13 g/dL (ref 12.0–15.0)
MCH: 29.5 pg (ref 26.0–34.0)
MCHC: 32.5 g/dL (ref 30.0–36.0)
MCV: 90.9 fL (ref 80.0–100.0)
Platelets: 236 10*3/uL (ref 150–400)
RBC: 4.4 MIL/uL (ref 3.87–5.11)
RDW: 12.3 % (ref 11.5–15.5)
WBC: 7 10*3/uL (ref 4.0–10.5)
nRBC: 0 % (ref 0.0–0.2)

## 2024-01-25 LAB — ETHANOL: Alcohol, Ethyl (B): <15 mg/dL (ref <15)

## 2024-01-25 MED ORDER — SODIUM CHLORIDE 0.9 % IV SOLN: Freq: Once | INTRAVENOUS | Status: AC

## 2024-01-25 MED ORDER — CYCLOBENZAPRINE HCL 10 MG PO TABS
10.0000 mg | ORAL_TABLET | Freq: Once | ORAL | Status: AC
  Administered 2024-01-25: 10 mg via ORAL
  Filled 2024-01-25: qty 1

## 2024-01-25 MED ORDER — KETOROLAC TROMETHAMINE 15 MG/ML IJ SOLN
30.0000 mg | Freq: Once | INTRAMUSCULAR | Status: AC
  Administered 2024-01-25: 30 mg via INTRAMUSCULAR
  Filled 2024-01-25: qty 2

## 2024-01-25 MED ORDER — ONDANSETRON HCL 4 MG/2ML IJ SOLN
4.0000 mg | Freq: Once | INTRAMUSCULAR | Status: AC
  Administered 2024-01-25: 4 mg via INTRAVENOUS
  Filled 2024-01-25: qty 2

## 2024-01-25 MED ORDER — CYCLOBENZAPRINE HCL 10 MG PO TABS: 10.0000 mg | ORAL_TABLET | Freq: Two times a day (BID) | ORAL | 0 refills | Status: DC | PRN

## 2024-01-25 MED ORDER — NAPROXEN 500 MG PO TABS: 500.0000 mg | ORAL_TABLET | Freq: Two times a day (BID) | ORAL | 0 refills | Status: DC

## 2024-01-25 MED ORDER — KETOROLAC TROMETHAMINE 15 MG/ML IJ SOLN
15.0000 mg | Freq: Once | INTRAMUSCULAR | Status: DC
  Filled 2024-01-25: qty 1

## 2024-01-25 MED ORDER — SODIUM CHLORIDE 0.9 % IV BOLUS: 125.0000 mL | Freq: Once | INTRAVENOUS | Status: DC

## 2024-01-25 MED ORDER — FENTANYL CITRATE PF 50 MCG/ML IJ SOSY
50.0000 ug | PREFILLED_SYRINGE | Freq: Once | INTRAMUSCULAR | Status: AC
  Administered 2024-01-25: 50 ug via INTRAVENOUS
  Filled 2024-01-25: qty 1

## 2024-01-25 MED ORDER — IOHEXOL 350 MG/ML SOLN
75.0000 mL | Freq: Once | INTRAVENOUS | Status: AC | PRN
  Administered 2024-01-25: 75 mL via INTRAVENOUS
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
## 2024-01-25 NOTE — Discharge Instructions (Signed)
 All the x-rays look okay without any sign of internal damage or broken bones.  You do have the nodule in your thyroid which you already know about there is also what appears to be a cyst in your pancreas that you need to follow-up with your regular doctor about.  They should be able to pull up the images if you let them know.  You are going to be very sore over the next few days and it may be worse by Saturday.  Make sure you are taking it easy and use the muscle relaxer and anti-inflammatory as needed.  Also you can take extra strength Tylenol  2 tablets every 6 hours.

## 2024-01-25 NOTE — ED Notes (Signed)
 Pt care taken, awaiting imaging.

## 2024-01-25 NOTE — ED Notes (Signed)
 Pt just taken for scans.

## 2024-02-09 ENCOUNTER — Ambulatory Visit: Payer: Medicaid Other | Admitting: Internal Medicine

## 2024-02-16 ENCOUNTER — Other Ambulatory Visit: Payer: Self-pay

## 2024-02-16 ENCOUNTER — Inpatient Hospital Stay (HOSPITAL_BASED_OUTPATIENT_CLINIC_OR_DEPARTMENT_OTHER)
Admission: EM | Admit: 2024-02-16 | Discharge: 2024-02-20 | DRG: 690 | Disposition: A | Discharge status: Home / Self Care | Attending: Internal Medicine | Admitting: Internal Medicine

## 2024-02-16 ENCOUNTER — Encounter (HOSPITAL_BASED_OUTPATIENT_CLINIC_OR_DEPARTMENT_OTHER): Payer: Self-pay

## 2024-02-16 DIAGNOSIS — Z79899 Other long term (current) drug therapy: Secondary | ICD-10-CM

## 2024-02-16 DIAGNOSIS — Z8711 Personal history of peptic ulcer disease: Secondary | ICD-10-CM

## 2024-02-16 DIAGNOSIS — Z888 Allergy status to other drugs, medicaments and biological substances status: Secondary | ICD-10-CM

## 2024-02-16 DIAGNOSIS — Z1624 Resistance to multiple antibiotics: Secondary | ICD-10-CM | POA: Diagnosis present

## 2024-02-16 DIAGNOSIS — N390 Urinary tract infection, site not specified: Secondary | ICD-10-CM | POA: Diagnosis present

## 2024-02-16 DIAGNOSIS — Z87891 Personal history of nicotine dependence: Secondary | ICD-10-CM

## 2024-02-16 DIAGNOSIS — E669 Obesity, unspecified: Secondary | ICD-10-CM | POA: Diagnosis present

## 2024-02-16 DIAGNOSIS — Z87442 Personal history of urinary calculi: Secondary | ICD-10-CM

## 2024-02-16 DIAGNOSIS — Z883 Allergy status to other anti-infective agents status: Secondary | ICD-10-CM

## 2024-02-16 DIAGNOSIS — Z8261 Family history of arthritis: Secondary | ICD-10-CM

## 2024-02-16 DIAGNOSIS — K529 Noninfective gastroenteritis and colitis, unspecified: Secondary | ICD-10-CM | POA: Diagnosis present

## 2024-02-16 DIAGNOSIS — Z860100 Personal history of colon polyps, unspecified: Secondary | ICD-10-CM

## 2024-02-16 DIAGNOSIS — B9620 Unspecified Escherichia coli [E. coli] as the cause of diseases classified elsewhere: Secondary | ICD-10-CM | POA: Diagnosis present

## 2024-02-16 DIAGNOSIS — Z791 Long term (current) use of non-steroidal anti-inflammatories (NSAID): Secondary | ICD-10-CM

## 2024-02-16 DIAGNOSIS — Z825 Family history of asthma and other chronic lower respiratory diseases: Secondary | ICD-10-CM

## 2024-02-16 DIAGNOSIS — E785 Hyperlipidemia, unspecified: Secondary | ICD-10-CM | POA: Diagnosis present

## 2024-02-16 DIAGNOSIS — E039 Hypothyroidism, unspecified: Secondary | ICD-10-CM | POA: Diagnosis present

## 2024-02-16 DIAGNOSIS — Z882 Allergy status to sulfonamides status: Secondary | ICD-10-CM

## 2024-02-16 DIAGNOSIS — Z1612 Extended spectrum beta lactamase (ESBL) resistance: Secondary | ICD-10-CM | POA: Diagnosis present

## 2024-02-16 DIAGNOSIS — N3000 Acute cystitis without hematuria: Principal | ICD-10-CM | POA: Diagnosis present

## 2024-02-16 DIAGNOSIS — I10 Essential (primary) hypertension: Secondary | ICD-10-CM | POA: Diagnosis present

## 2024-02-16 DIAGNOSIS — Z6836 Body mass index (BMI) 36.0-36.9, adult: Secondary | ICD-10-CM

## 2024-02-16 DIAGNOSIS — Z8249 Family history of ischemic heart disease and other diseases of the circulatory system: Secondary | ICD-10-CM

## 2024-02-16 DIAGNOSIS — Z87440 Personal history of urinary (tract) infections: Secondary | ICD-10-CM

## 2024-02-16 DIAGNOSIS — K863 Pseudocyst of pancreas: Secondary | ICD-10-CM | POA: Diagnosis present

## 2024-02-16 LAB — URINALYSIS, ROUTINE W REFLEX MICROSCOPIC
Bilirubin Urine: NEGATIVE
Glucose, UA: NEGATIVE mg/dL
Hgb urine dipstick: NEGATIVE
Ketones, ur: NEGATIVE mg/dL
Nitrite: NEGATIVE
Protein, ur: NEGATIVE mg/dL
Specific Gravity, Urine: 1.012 (ref 1.005–1.030)
pH: 7 (ref 5.0–8.0)

## 2024-02-16 LAB — COMPREHENSIVE METABOLIC PANEL WITH GFR
ALT: 22 U/L (ref 0–44)
AST: 23 U/L (ref 15–41)
Albumin: 4.5 g/dL (ref 3.5–5.0)
Alkaline Phosphatase: 102 U/L (ref 38–126)
Anion gap: 12 (ref 5–15)
BUN: 15 mg/dL (ref 6–20)
CO2: 26 mmol/L (ref 22–32)
Calcium: 10.2 mg/dL (ref 8.9–10.3)
Chloride: 102 mmol/L (ref 98–111)
Creatinine, Ser: 0.78 mg/dL (ref 0.44–1.00)
GFR, Estimated: >60 mL/min (ref >60)
Glucose, Bld: 87 mg/dL (ref 70–99)
Potassium: 4.3 mmol/L (ref 3.5–5.1)
Sodium: 141 mmol/L (ref 135–145)
Total Bilirubin: 0.6 mg/dL (ref 0.0–1.2)
Total Protein: 7.1 g/dL (ref 6.5–8.1)

## 2024-02-16 LAB — CBC WITH DIFFERENTIAL/PLATELET
Abs Immature Granulocytes: 0.02 10*3/uL (ref 0.00–0.07)
Basophils Absolute: 0 10*3/uL (ref 0.0–0.1)
Basophils Relative: 1 %
Eosinophils Absolute: 0.3 10*3/uL (ref 0.0–0.5)
Eosinophils Relative: 4 %
HCT: 42 % (ref 36.0–46.0)
Hemoglobin: 13.9 g/dL (ref 12.0–15.0)
Immature Granulocytes: 0 %
Lymphocytes Relative: 27 %
Lymphs Abs: 2.1 10*3/uL (ref 0.7–4.0)
MCH: 30.3 pg (ref 26.0–34.0)
MCHC: 33.1 g/dL (ref 30.0–36.0)
MCV: 91.5 fL (ref 80.0–100.0)
Monocytes Absolute: 0.4 10*3/uL (ref 0.1–1.0)
Monocytes Relative: 5 %
Neutro Abs: 4.9 10*3/uL (ref 1.7–7.7)
Neutrophils Relative %: 63 %
Platelets: 283 10*3/uL (ref 150–400)
RBC: 4.59 MIL/uL (ref 3.87–5.11)
RDW: 12.6 % (ref 11.5–15.5)
WBC: 7.7 10*3/uL (ref 4.0–10.5)
nRBC: 0 % (ref 0.0–0.2)

## 2024-02-16 MED ORDER — SODIUM CHLORIDE 0.9 % IV SOLN
2.0000 g | Freq: Three times a day (TID) | INTRAVENOUS | Status: DC
  Administered 2024-02-16 – 2024-02-17 (×2): 2 g via INTRAVENOUS
  Filled 2024-02-16 (×2): qty 12.5

## 2024-02-16 NOTE — ED Notes (Signed)
 NP-Laura Vanderberg @ [facility] called report - sending Rheda Roanne Chi over. She has UTI and resistant to the oral meds they have. She is allergic to Microbid that they do have. She states the patient will need IV antibiotics. She is unsure what time the patient will be in because she has to pick up her children.

## 2024-02-16 NOTE — ED Triage Notes (Addendum)
 Arrives POV with complaints of an ongoing UTI that has become resistant to oral meds. Patient was sent here from PCP for IV antibiotics.

## 2024-02-16 NOTE — ED Notes (Signed)
 ED Provider at bedside.

## 2024-02-16 NOTE — ED Provider Notes (Signed)
  EMERGENCY DEPARTMENT AT [facility] Provider Note   CSN: [id number] Arrival date & time: 02/16/24  1739     History Chief Complaint  Patient presents with   Urinary Problem   Dysuria    Tamara Hicks is a 53 y.o. female.  Patient presents the emergency department today with concerns of dysuria ongoing for several weeks.  Reports has been on several rounds of antibiotics including nitrofurantoin which he appears to have some sort of allergic reaction to as well as ciprofloxacin with no resolution in symptoms.  She had negative culture testing performed by her primary care office and was advised to come to the emergency department for IV antibiotics today.   Dysuria      Home Medications Prior to Admission medications   Medication Sig Start Date End Date Taking? Authorizing Provider  azelastine  (ASTELIN ) 0.1 % nasal spray Place 2 sprays into both nostrils 2 (two) times daily as needed for allergies or rhinitis. Use in each nostril as directed 10/18/23   Kandice Orleans, MD  buPROPion (WELLBUTRIN XL) 300 MG 24 hr tablet Take 300 mg by mouth daily. 02/22/18   [provider]  cetirizine  (ZYRTEC ) 10 MG tablet Take 1 tablet (10 mg total) by mouth daily. 09/22/23   Kandice Orleans, MD  cholecalciferol (VITAMIN D3) 25 MCG (1000 UNIT) tablet Take 1 tablet by mouth daily.    [provider]  cyclobenzaprine  (FLEXERIL ) 10 MG tablet Take 1 tablet (10 mg total) by mouth 2 (two) times daily as needed for muscle spasms. 01/25/24   Almond Army, MD  dexlansoprazole (DEXILANT) 60 MG capsule Take 60 mg by mouth daily.    [provider]  fluticasone  (FLONASE ) 50 MCG/ACT nasal spray Place 2 sprays into both nostrils daily. 09/22/23   Kandice Orleans, MD  hydrochlorothiazide (HYDRODIURIL) 12.5 MG tablet Take 12.5 mg by mouth daily. Patient not taking: Reported on 09/22/2023 07/02/18   [provider]  levothyroxine (SYNTHROID, LEVOTHROID) 50 MCG tablet  Take 50 mcg by mouth daily before breakfast.    [provider]  LORazepam (ATIVAN) 0.5 MG tablet Take 0.5 mg by mouth as needed. 05/05/21   [provider]  losartan (COZAAR) 25 MG tablet Take 25 mg by mouth daily.    [provider]  meloxicam (MOBIC) 15 MG tablet Take 15 mg by mouth daily.    [provider]  mesalamine (LIALDA) 1.2 g EC tablet Take 1.2 g by mouth daily with breakfast.  12/08/17   [provider]  naproxen  (NAPROSYN ) 500 MG tablet Take 1 tablet (500 mg total) by mouth 2 (two) times daily. 01/25/24   Almond Army, MD  ondansetron  (ZOFRAN -ODT) 4 MG disintegrating tablet Take 4 mg by mouth as needed.    [provider]  PATANOL 0.1 % ophthalmic solution  12/28/17   [provider]  propranolol (INDERAL) 10 MG tablet Take 10 mg by mouth as needed. 07/24/23   [provider]  topiramate (TOPAMAX) 50 MG tablet Take 50 mg by mouth daily. 12/13/21   [provider]  torsemide (DEMADEX) 10 MG tablet Take 10 mg by mouth daily.    [provider]      Allergies    Bactrim [sulfamethoxazole-trimethoprim] and Nitrofuran derivatives    Review of Systems   Review of Systems  Genitourinary:  Positive for dysuria.  All other systems reviewed and are negative.   Physical Exam Updated Vital Signs BP 133/74   Pulse 70  Temp 98.5 F (36.9 C) (Oral)   Resp 18   Ht 5\' 3"  (1.6 m)   Wt 93.9 kg   LMP 05/29/2018 (Approximate)   SpO2 100%   BMI 36.67 kg/m  Physical Exam Vitals and nursing note reviewed.  Constitutional:      General: She is not in acute distress.    Appearance: She is well-developed.  HENT:     Head: Normocephalic and atraumatic.  Eyes:     Conjunctiva/sclera: Conjunctivae normal.  Cardiovascular:     Rate and Rhythm: Normal rate and regular rhythm.     Heart sounds: No murmur heard. Pulmonary:     Effort: Pulmonary effort is normal. No respiratory distress.     Breath  sounds: Normal breath sounds.  Abdominal:     Palpations: Abdomen is soft.     Tenderness: There is no abdominal tenderness.  Musculoskeletal:        General: No swelling.     Cervical back: Neck supple.  Skin:    General: Skin is warm and dry.     Capillary Refill: Capillary refill takes less than 2 seconds.  Neurological:     Mental Status: She is alert.  Psychiatric:        Mood and Affect: Mood normal.     ED Results / Procedures / Treatments   Labs (all labs ordered are listed, but only abnormal results are displayed) Labs Reviewed  URINALYSIS, ROUTINE W REFLEX MICROSCOPIC - Abnormal; Notable for the following components:      Result Value   Leukocytes,Ua MODERATE (*)    Bacteria, UA MANY (*)    All other components within normal limits  URINE CULTURE  CBC WITH DIFFERENTIAL/PLATELET  COMPREHENSIVE METABOLIC PANEL WITH GFR    EKG None  Radiology No results found.  Procedures Procedures    Medications Ordered in ED Medications  ceFEPIme  (MAXIPIME ) 2 g in sodium chloride  0.9 % 100 mL IVPB (2 g Intravenous New Bag/Given 02/16/24 2328)    ED Course/ Medical Decision Making/ A&P                                 Medical Decision Making Amount and/or Complexity of Data Reviewed Labs: ordered.   This patient presents to the ED for concern of UTI. Differential diagnosis includes resistant UTI, urosepsis, pyelonephritis, STI   Lab Tests:  I Ordered, and personally interpreted labs.  The pertinent results include: CBC and CMP unremarkable, urinalysis with some concerns for infection with many bacteria and moderate leukocytes but no nitrates seen, urine culture in process   Medicines ordered and prescription drug management:  I ordered medication including cefepime  for UTI Reevaluation of the patient after these medicines showed that the patient improved I have reviewed the patients home medicines and have made adjustments as needed   Problem List / ED  Course:  Patient presents to the emergency department today with concerns of dysuria.  Reports that this has been ongoing for the last several weeks and has tried several different antibiotics including nitrofurantoin as well as ciprofloxacin.  Patient reportedly had somewhat of an allergic reaction to nitrofurantoin with reported hives, red irritated skin the lower extremities, but denies any shortness of breath or throat swelling.  She states that she had also been on daily ciprofloxacin for several days with some improvement in symptoms but rebounded symptoms when she stopped the antibiotics.  She had urine testing performed by  primary care provider with results available today from her culture report that shows that she is resistant to every oral antibiotic except nitrofurantoin that there is concern for possible allergic reaction.  Was advised by PCP to come to the emergency department for IV antibiotics. Spoke with pharmacist regarding patient's Ucx results and advised that patient could try fosfomycin but unclear if this would treat infection. Advised IV antibiotics and admission if truly resistant to all PO options and allergic to nitrofurantoin. Patient agreeable with admission for IV antibiotics for UTI at this time.  She is otherwise well-appearing at this time and I doubt urosepsis or pyelonephritis.  No indication for imaging at this time as patient does not have any abdominal tenderness or flank pain.  Her vitals have remained stable but given concerns for resistant UTI with no oral antibiotic options, will consult hospitalist for admission. Spoke with Dr. Felipe Horton, hospitalist, who will be admitting patient.  Final Clinical Impression(s) / ED Diagnoses Final diagnoses:  Acute cystitis without hematuria    Rx / DC Orders ED Discharge Orders     None         Concetta Dee, PA-C 02/16/24 2339    Flonnie Humphrey, DO 02/19/24 2334

## 2024-02-17 ENCOUNTER — Inpatient Hospital Stay (HOSPITAL_COMMUNITY)

## 2024-02-17 DIAGNOSIS — Z1624 Resistance to multiple antibiotics: Secondary | ICD-10-CM

## 2024-02-17 DIAGNOSIS — Z825 Family history of asthma and other chronic lower respiratory diseases: Secondary | ICD-10-CM | POA: Diagnosis not present

## 2024-02-17 DIAGNOSIS — Z79899 Other long term (current) drug therapy: Secondary | ICD-10-CM | POA: Diagnosis not present

## 2024-02-17 DIAGNOSIS — Z6836 Body mass index (BMI) 36.0-36.9, adult: Secondary | ICD-10-CM | POA: Diagnosis not present

## 2024-02-17 DIAGNOSIS — K529 Noninfective gastroenteritis and colitis, unspecified: Secondary | ICD-10-CM | POA: Diagnosis present

## 2024-02-17 DIAGNOSIS — E785 Hyperlipidemia, unspecified: Secondary | ICD-10-CM | POA: Diagnosis present

## 2024-02-17 DIAGNOSIS — Z8249 Family history of ischemic heart disease and other diseases of the circulatory system: Secondary | ICD-10-CM | POA: Diagnosis not present

## 2024-02-17 DIAGNOSIS — Z882 Allergy status to sulfonamides status: Secondary | ICD-10-CM | POA: Diagnosis not present

## 2024-02-17 DIAGNOSIS — B9620 Unspecified Escherichia coli [E. coli] as the cause of diseases classified elsewhere: Secondary | ICD-10-CM | POA: Diagnosis present

## 2024-02-17 DIAGNOSIS — Z883 Allergy status to other anti-infective agents status: Secondary | ICD-10-CM | POA: Diagnosis not present

## 2024-02-17 DIAGNOSIS — N3000 Acute cystitis without hematuria: Secondary | ICD-10-CM | POA: Diagnosis present

## 2024-02-17 DIAGNOSIS — Z1612 Extended spectrum beta lactamase (ESBL) resistance: Secondary | ICD-10-CM | POA: Diagnosis present

## 2024-02-17 DIAGNOSIS — E039 Hypothyroidism, unspecified: Secondary | ICD-10-CM | POA: Diagnosis present

## 2024-02-17 DIAGNOSIS — Z87891 Personal history of nicotine dependence: Secondary | ICD-10-CM | POA: Diagnosis not present

## 2024-02-17 DIAGNOSIS — N390 Urinary tract infection, site not specified: Secondary | ICD-10-CM | POA: Diagnosis present

## 2024-02-17 DIAGNOSIS — E669 Obesity, unspecified: Secondary | ICD-10-CM | POA: Diagnosis present

## 2024-02-17 DIAGNOSIS — Z888 Allergy status to other drugs, medicaments and biological substances status: Secondary | ICD-10-CM | POA: Diagnosis not present

## 2024-02-17 DIAGNOSIS — Z8711 Personal history of peptic ulcer disease: Secondary | ICD-10-CM | POA: Diagnosis not present

## 2024-02-17 DIAGNOSIS — Z87442 Personal history of urinary calculi: Secondary | ICD-10-CM | POA: Diagnosis not present

## 2024-02-17 DIAGNOSIS — A499 Bacterial infection, unspecified: Secondary | ICD-10-CM | POA: Diagnosis not present

## 2024-02-17 DIAGNOSIS — K863 Pseudocyst of pancreas: Secondary | ICD-10-CM | POA: Diagnosis present

## 2024-02-17 DIAGNOSIS — Z8261 Family history of arthritis: Secondary | ICD-10-CM | POA: Diagnosis not present

## 2024-02-17 DIAGNOSIS — Z87440 Personal history of urinary (tract) infections: Secondary | ICD-10-CM | POA: Diagnosis not present

## 2024-02-17 DIAGNOSIS — Z860100 Personal history of colon polyps, unspecified: Secondary | ICD-10-CM | POA: Diagnosis not present

## 2024-02-17 DIAGNOSIS — Z791 Long term (current) use of non-steroidal anti-inflammatories (NSAID): Secondary | ICD-10-CM | POA: Diagnosis not present

## 2024-02-17 DIAGNOSIS — I10 Essential (primary) hypertension: Secondary | ICD-10-CM | POA: Diagnosis present

## 2024-02-17 LAB — MAGNESIUM: Magnesium: 2.4 mg/dL (ref 1.7–2.4)

## 2024-02-17 LAB — CBC WITH DIFFERENTIAL/PLATELET
Abs Immature Granulocytes: 0.04 10*3/uL (ref 0.00–0.07)
Basophils Absolute: 0.1 10*3/uL (ref 0.0–0.1)
Basophils Relative: 1 %
Eosinophils Absolute: 0.4 10*3/uL (ref 0.0–0.5)
Eosinophils Relative: 5 %
HCT: 39.4 % (ref 36.0–46.0)
Hemoglobin: 13.3 g/dL (ref 12.0–15.0)
Immature Granulocytes: 1 %
Lymphocytes Relative: 24 %
Lymphs Abs: 1.9 10*3/uL (ref 0.7–4.0)
MCH: 30.2 pg (ref 26.0–34.0)
MCHC: 33.8 g/dL (ref 30.0–36.0)
MCV: 89.3 fL (ref 80.0–100.0)
Monocytes Absolute: 0.5 10*3/uL (ref 0.1–1.0)
Monocytes Relative: 7 %
Neutro Abs: 4.7 10*3/uL (ref 1.7–7.7)
Neutrophils Relative %: 62 %
Platelets: 253 10*3/uL (ref 150–400)
RBC: 4.41 MIL/uL (ref 3.87–5.11)
RDW: 12.4 % (ref 11.5–15.5)
WBC: 7.6 10*3/uL (ref 4.0–10.5)
nRBC: 0 % (ref 0.0–0.2)

## 2024-02-17 LAB — COMPREHENSIVE METABOLIC PANEL WITH GFR
ALT: 20 U/L (ref 0–44)
AST: 18 U/L (ref 15–41)
Albumin: 3.4 g/dL — ABNORMAL LOW (ref 3.5–5.0)
Alkaline Phosphatase: 68 U/L (ref 38–126)
Anion gap: 7 (ref 5–15)
BUN: 12 mg/dL (ref 6–20)
CO2: 25 mmol/L (ref 22–32)
Calcium: 9 mg/dL (ref 8.9–10.3)
Chloride: 107 mmol/L (ref 98–111)
Creatinine, Ser: 0.74 mg/dL (ref 0.44–1.00)
GFR, Estimated: >60 mL/min (ref >60)
Glucose, Bld: 100 mg/dL — ABNORMAL HIGH (ref 70–99)
Potassium: 4 mmol/L (ref 3.5–5.1)
Sodium: 139 mmol/L (ref 135–145)
Total Bilirubin: 1.1 mg/dL (ref 0.0–1.2)
Total Protein: 6 g/dL — ABNORMAL LOW (ref 6.5–8.1)

## 2024-02-17 MED ORDER — ACETAMINOPHEN 650 MG RE SUPP
650.0000 mg | Freq: Four times a day (QID) | RECTAL | Status: DC | PRN
Start: 2024-02-17 — End: 2024-02-20

## 2024-02-17 MED ORDER — ADULT MULTIVITAMIN W/MINERALS CH
1.0000 | ORAL_TABLET | Freq: Every day | ORAL | Status: DC
  Administered 2024-02-17 – 2024-02-20 (×4): 1 via ORAL
  Filled 2024-02-17 (×4): qty 1

## 2024-02-17 MED ORDER — ENSURE PLUS HIGH PROTEIN PO LIQD
237.0000 mL | Freq: Three times a day (TID) | ORAL | Status: DC
  Administered 2024-02-17 – 2024-02-20 (×5): 237 mL via ORAL

## 2024-02-17 MED ORDER — MELATONIN 3 MG PO TABS: 3.0000 mg | ORAL_TABLET | Freq: Every evening | ORAL | Status: DC | PRN

## 2024-02-17 MED ORDER — ONDANSETRON HCL 4 MG/2ML IJ SOLN: 4.0000 mg | Freq: Four times a day (QID) | INTRAMUSCULAR | Status: DC | PRN

## 2024-02-17 MED ORDER — ENOXAPARIN SODIUM 40 MG/0.4ML IJ SOSY
40.0000 mg | PREFILLED_SYRINGE | INTRAMUSCULAR | Status: DC
  Administered 2024-02-17 – 2024-02-20 (×4): 40 mg via SUBCUTANEOUS
  Filled 2024-02-17 (×4): qty 0.4

## 2024-02-17 MED ORDER — GADOBUTROL 1 MMOL/ML IV SOLN
10.0000 mL | Freq: Once | INTRAVENOUS | Status: AC | PRN
  Administered 2024-02-17: 10 mL via INTRAVENOUS

## 2024-02-17 MED ORDER — LOSARTAN POTASSIUM 50 MG PO TABS
25.0000 mg | ORAL_TABLET | Freq: Every day | ORAL | Status: DC
  Administered 2024-02-17 – 2024-02-20 (×4): 25 mg via ORAL
  Filled 2024-02-17 (×4): qty 1

## 2024-02-17 MED ORDER — LEVOTHYROXINE SODIUM 50 MCG PO TABS
50.0000 ug | ORAL_TABLET | Freq: Every day | ORAL | Status: DC
  Administered 2024-02-17 – 2024-02-20 (×4): 50 ug via ORAL
  Filled 2024-02-17 (×4): qty 1

## 2024-02-17 MED ORDER — SODIUM CHLORIDE 0.9% FLUSH
3.0000 mL | Freq: Two times a day (BID) | INTRAVENOUS | Status: DC
  Administered 2024-02-17 – 2024-02-20 (×7): 3 mL via INTRAVENOUS

## 2024-02-17 MED ORDER — ACETAMINOPHEN 325 MG PO TABS
650.0000 mg | ORAL_TABLET | Freq: Four times a day (QID) | ORAL | Status: DC | PRN
Start: 2024-02-17 — End: 2024-02-20
  Administered 2024-02-18: 650 mg via ORAL
  Filled 2024-02-17: qty 2

## 2024-02-17 MED ORDER — TORSEMIDE 10 MG PO TABS
5.0000 mg | ORAL_TABLET | Freq: Every day | ORAL | Status: DC
  Administered 2024-02-17 – 2024-02-20 (×4): 5 mg via ORAL
  Filled 2024-02-17 (×4): qty 0.5

## 2024-02-17 MED ORDER — SODIUM CHLORIDE 0.9 % IV SOLN
1.0000 g | Freq: Two times a day (BID) | INTRAVENOUS | Status: DC
  Administered 2024-02-17 – 2024-02-18 (×4): 1 g via INTRAVENOUS
  Filled 2024-02-17 (×5): qty 20

## 2024-02-17 NOTE — Progress Notes (Signed)
 Initial Nutrition Assessment  DOCUMENTATION CODES:   Obesity unspecified  INTERVENTION:   -Continue regular diet -MVI with minerals daily -Ensure Plus High Protein po BID, each supplement provides 350 kcal and 20 grams of protein  NUTRITION DIAGNOSIS:   Increased nutrient needs related to acute illness as evidenced by estimated needs.  GOAL:   Patient will meet greater than or equal to 90% of their needs  MONITOR:   PO intake, Supplement acceptance  REASON FOR ASSESSMENT:   Consult Assessment of nutrition requirement/status  ASSESSMENT:   Pt with medical history significant of hypertension, hypothyroidism, dyslipidemia, remote renal calculi and chronic diarrhea.  Patient was sent to [facility] due to issues related to recurrent UTI with most recent urine culture in the outpatient setting confirming multidrug-resistant organism.  Pt admitted with recurrent UTI with MDR E Coli.   Reviewed I/O's: +340 ml x 24 hours  Pt unavailable at time of visit. Attempted to speak with pt via call to hospital room phone, however, unable to reach. RD unable to obtain further nutrition-related history or complete nutrition-focused physical exam at this time.     Per H&P, pt has had chronic diarrhea for several years, but improved with diet modification. She fasts (sometimes for 4 days) and also uses BRAT diet at times. She has seen GI who performed colonoscopy, but did not reveal any diagnosis for cause of diarrhea. MD has started work-up for celiac disease. Pt has used protein supplements in the past.   Noted pt has had multiple UTIs with use of antibiotics, which also may be contributing to diarrhea.   Pt currently on a regular diet. No meal completion data available to assess at this time.   Reviewed wt hx; pt has experienced a 7.3% wt loss over the past 6 months. While this is not significant for time frame, it is concerning given history of longstanding diarrhea.   Medications  reviewed and include lovenox and demadex.   Labs reviewed.    Diet Order:   Diet Order             Diet regular Room service appropriate? Yes; Fluid consistency: Thin  Diet effective now                   EDUCATION NEEDS:   No education needs have been identified at this time  Skin:  Skin Assessment: Reviewed RN Assessment  Last BM:     Height:   Ht Readings from Last 1 Encounters:  02/17/24 5\' 3"  (1.6 m)    Weight:   Wt Readings from Last 1 Encounters:  02/17/24 95.5 kg    Ideal Body Weight:  52.3 kg  BMI:  Body mass index is 37.3 kg/m.  Estimated Nutritional Needs:   Kcal:  1650-1850  Protein:  85-100 grams  Fluid:  1.6-1.8 L    Herschel Lords, RD, LDN, CDCES Registered Dietitian III Certified Diabetes Care and Education Specialist If unable to reach this RD, please use "RD Inpatient" group chat on secure chat between hours of 8am-4 pm daily
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
## 2024-02-17 NOTE — Plan of Care (Signed)

## 2024-02-17 NOTE — ED Notes (Signed)
 Writer called [facility]C and notified that patient is en route.

## 2024-02-17 NOTE — ED Notes (Signed)
 Pt leaving with [facility] at this time. Pt in NAD upon departure. Paperwork sent with [facility].

## 2024-02-17 NOTE — Progress Notes (Signed)
(  Carryover admission to the Day Admitter; accepted by Dr. Manny Sees as transfer from  [facility]  to a  med-surg bed at  [facility]  for acute cystitis refractory to outpatient antibiotics. Please see Dr.  Shirlie Dove transfer documentation in [facility] Communication for additional details).   I have placed some additional preliminary admit orders via the adult multi-morbid admission order set. I have also ordered continuation of the cefepime  that was initiated at [facility].  I have also ordered prn acetaminophen  as well as morning labs that include CMP, CBC, magnesium level.    Camelia Cavalier, DO Hospitalist

## 2024-02-18 DIAGNOSIS — Z1612 Extended spectrum beta lactamase (ESBL) resistance: Secondary | ICD-10-CM | POA: Diagnosis not present

## 2024-02-18 DIAGNOSIS — A499 Bacterial infection, unspecified: Secondary | ICD-10-CM

## 2024-02-18 DIAGNOSIS — N390 Urinary tract infection, site not specified: Secondary | ICD-10-CM | POA: Diagnosis not present

## 2024-02-18 LAB — CBC
HCT: 44.6 % (ref 36.0–46.0)
Hemoglobin: 14.7 g/dL (ref 12.0–15.0)
MCH: 29.8 pg (ref 26.0–34.0)
MCHC: 33 g/dL (ref 30.0–36.0)
MCV: 90.5 fL (ref 80.0–100.0)
Platelets: 293 10*3/uL (ref 150–400)
RBC: 4.93 MIL/uL (ref 3.87–5.11)
RDW: 12.7 % (ref 11.5–15.5)
WBC: 7.4 10*3/uL (ref 4.0–10.5)
nRBC: 0 % (ref 0.0–0.2)

## 2024-02-18 LAB — COMPREHENSIVE METABOLIC PANEL WITH GFR
ALT: 20 U/L (ref 0–44)
AST: 19 U/L (ref 15–41)
Albumin: 3.8 g/dL (ref 3.5–5.0)
Alkaline Phosphatase: 74 U/L (ref 38–126)
Anion gap: 10 (ref 5–15)
BUN: 20 mg/dL (ref 6–20)
CO2: 24 mmol/L (ref 22–32)
Calcium: 9.5 mg/dL (ref 8.9–10.3)
Chloride: 103 mmol/L (ref 98–111)
Creatinine, Ser: 0.94 mg/dL (ref 0.44–1.00)
GFR, Estimated: >60 mL/min (ref >60)
Glucose, Bld: 105 mg/dL — ABNORMAL HIGH (ref 70–99)
Potassium: 4.3 mmol/L (ref 3.5–5.1)
Sodium: 137 mmol/L (ref 135–145)
Total Bilirubin: 1.1 mg/dL (ref 0.0–1.2)
Total Protein: 6.8 g/dL (ref 6.5–8.1)

## 2024-02-18 LAB — IGA: IgA: 215 mg/dL (ref 87–352)

## 2024-02-18 LAB — TISSUE TRANSGLUTAMINASE, IGA: Tissue Transglutaminase Ab, IgA: <2 U/mL (ref 0–3)

## 2024-02-18 NOTE — Progress Notes (Signed)
 Progress Note   Patient: Tamara Hicks [id number] DOB: [birth year] DOA: 02/16/2024  DOS: the patient was seen and examined on 02/18/2024   Brief hospital course:  53 y.o. female with medical history significant of hypertension, hypothyroidism, dyslipidemia, remote renal calculi and chronic diarrhea.  Patient was sent to [facility] due to issues related to recurrent UTI with most recent urine culture in the outpatient setting confirming multidrug-resistant organism.   Assessment and Plan:  Acute cystitis with ESBL E. coli - Failed outpatient regimen with continued burning and urgency.  Transition to meropenem yesterday with goal of 2 to 3 days treatment.  Patient very hesitant about attempting fosfomycin given her recurrence and is amenable to stay for complete IV antibiotic therapy.  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  Patient denies any known history of pancreatitis but does complain of chronic diarrhea that has been going on for many years.  Possibility of pancreatic insufficiency leading to diarrhea however unclear at this time.  Patient currently not complaining of any pain nor diarrhea.  Hypertension - Continue home medication regimen.  Chronic diarrhea - Showing improvement last couple months with brat diet.  Used to have constant loose bowels however recently has shown improvement.  Does not feel she has adequate workup in the outpatient setting.  Celiac workup pending.  Nutrition consultation pending.  Hypothyroidism - Synthroid on board.    Subjective: Patient resting well this morning.  Denies any fever, chills, chest pain, nausea, vomiting, abdominal pain.  Urinary burning mildly improved.  Physical Exam:  Vitals:   02/18/24 0438 02/18/24 0455 02/18/24 0816 02/18/24 1522  BP: 122/78 98/74 (!) 124/91 (!) 128/98  Pulse: 75 78 81 (!) 105  Resp: 20 20 17  (!) 170  Temp: 97.6 F (36.4 C) 97.6 F (36.4 C) 98 F (36.7 C)  98.4 F (36.9 C)  TempSrc:  Oral Oral Oral  SpO2: 98% 95% 96% 96%  Weight:  90.6 kg    Height:        GENERAL:  Alert, pleasant, no acute distress  HEENT:  EOMI CARDIOVASCULAR:  RRR, no murmurs appreciated RESPIRATORY:  Clear to auscultation, no wheezing, rales, or rhonchi GASTROINTESTINAL:  Soft, nontender, nondistended EXTREMITIES:  No LE edema bilaterally NEURO:  No new focal deficits appreciated SKIN:  No rashes noted PSYCH:  Appropriate mood and affect     Data Reviewed:  No new imaging to review  Previous records (including but not limited to H&P, progress notes, nursing notes, TOC management) were reviewed in assessment of this patient.  Labs: CBC: Recent Labs  Lab 02/16/24 2010 02/17/24 0447 02/18/24 0354  WBC 7.7 7.6 7.4  NEUTROABS 4.9 4.7  --   HGB 13.9 13.3 14.7  HCT 42.0 39.4 44.6  MCV 91.5 89.3 90.5  PLT 283 253 293   Basic Metabolic Panel: Recent Labs  Lab 02/16/24 2010 02/17/24 0447 02/18/24 0354  NA 141 139 137  K 4.3 4.0 4.3  CL 102 107 103  CO2 26 25 24   GLUCOSE 87 100* 105*  BUN 15 12 20   CREATININE 0.78 0.74 0.94  CALCIUM 10.2 9.0 9.5  MG  --  2.4  --    Liver Function Tests: Recent Labs  Lab 02/16/24 2010 02/17/24 0447 02/18/24 0354  AST 23 18 19   ALT 22 20 20   ALKPHOS 102 68 74  BILITOT 0.6 1.1 1.1  PROT 7.1 6.0* 6.8  ALBUMIN 4.5 3.4* 3.8   CBG: No results for input(s): "  GLUCAP" in the last 168 hours.  Scheduled Meds:  enoxaparin (LOVENOX) injection  40 mg Subcutaneous Q24H   feeding supplement  237 mL Oral TID BM   levothyroxine  50 mcg Oral Q0600   losartan  25 mg Oral Daily   multivitamin with minerals  1 tablet Oral Daily   sodium chloride  flush  3 mL Intravenous Q12H   torsemide  5 mg Oral Daily   Continuous Infusions:  meropenem (MERREM) IV 1 g (02/18/24 0811)   PRN Meds:.acetaminophen  **OR** acetaminophen , melatonin, ondansetron  (ZOFRAN ) IV  Family Communication: None at bedside  Disposition: Status  is: Inpatient Remains inpatient appropriate because: ESBL UTI     Time spent: 32 minutes  Length of inpatient stay: 1 days  Author: Jodeane Mulligan, DO 02/18/2024 3:25 PM  For on call review [email or web address].

## 2024-02-18 NOTE — Hospital Course (Signed)
 53 y.o. female with medical history significant of hypertension, hypothyroidism, dyslipidemia, remote renal calculi and chronic diarrhea.  Patient was sent to [facility] due to issues related to recurrent UTI with most recent urine culture in the outpatient setting confirming multidrug-resistant organism.    Assessment and Plan:   Acute cystitis with ESBL E. coli - Failed outpatient regimen with continued burning and urgency.  Transition to meropenem yesterday with goal of 2 to 3 days treatment.  Patient very hesitant about attempting fosfomycin given her recurrence and is amenable to stay for complete IV antibiotic therapy.   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  Patient denies any known history of pancreatitis but does complain of chronic diarrhea that has been going on for many years.  Possibility of pancreatic insufficiency leading to diarrhea however unclear at this time.  Patient currently not complaining of any pain nor diarrhea.   Hypertension - Continue home medication regimen.   Chronic diarrhea - Showing improvement last couple months with brat diet.  Used to have constant loose bowels however recently has shown improvement.  Does not feel she has adequate workup in the outpatient setting.  Celiac workup pending.  Nutrition consultation pending.   Hypothyroidism - Synthroid on board.

## 2024-02-18 NOTE — Plan of Care (Signed)
  Problem: Clinical Measurements: Goal: Diagnostic test results will improve Outcome: Progressing Goal: Respiratory complications will improve Outcome: Progressing Goal: Cardiovascular complication will be avoided Outcome: Progressing   Problem: Activity: Goal: Risk for activity intolerance will decrease Outcome: Progressing

## 2024-02-19 DIAGNOSIS — Z1612 Extended spectrum beta lactamase (ESBL) resistance: Secondary | ICD-10-CM

## 2024-02-19 DIAGNOSIS — N390 Urinary tract infection, site not specified: Secondary | ICD-10-CM | POA: Diagnosis not present

## 2024-02-19 DIAGNOSIS — B9620 Unspecified Escherichia coli [E. coli] as the cause of diseases classified elsewhere: Secondary | ICD-10-CM | POA: Diagnosis not present

## 2024-02-19 LAB — CBC
HCT: 46.4 % — ABNORMAL HIGH (ref 36.0–46.0)
Hemoglobin: 15.7 g/dL — ABNORMAL HIGH (ref 12.0–15.0)
MCH: 30.3 pg (ref 26.0–34.0)
MCHC: 33.8 g/dL (ref 30.0–36.0)
MCV: 89.6 fL (ref 80.0–100.0)
Platelets: 284 10*3/uL (ref 150–400)
RBC: 5.18 MIL/uL — ABNORMAL HIGH (ref 3.87–5.11)
RDW: 12.6 % (ref 11.5–15.5)
WBC: 7.8 10*3/uL (ref 4.0–10.5)
nRBC: 0 % (ref 0.0–0.2)

## 2024-02-19 LAB — BASIC METABOLIC PANEL WITH GFR
Anion gap: 6 (ref 5–15)
BUN: 20 mg/dL (ref 6–20)
CO2: 29 mmol/L (ref 22–32)
Calcium: 9.7 mg/dL (ref 8.9–10.3)
Chloride: 102 mmol/L (ref 98–111)
Creatinine, Ser: 0.91 mg/dL (ref 0.44–1.00)
GFR, Estimated: >60 mL/min (ref >60)
Glucose, Bld: 113 mg/dL — ABNORMAL HIGH (ref 70–99)
Potassium: 5.1 mmol/L (ref 3.5–5.1)
Sodium: 137 mmol/L (ref 135–145)

## 2024-02-19 LAB — URINE CULTURE: Culture: 70000 — AB

## 2024-02-19 MED ORDER — SODIUM CHLORIDE 0.9 % IV SOLN
1.0000 g | Freq: Three times a day (TID) | INTRAVENOUS | Status: DC
  Administered 2024-02-19 – 2024-02-20 (×4): 1 g via INTRAVENOUS
  Filled 2024-02-19 (×3): qty 20

## 2024-02-19 NOTE — Plan of Care (Signed)

## 2024-02-19 NOTE — TOC CM/SW Note (Signed)
 Transition of Care [facility]) - Inpatient Brief Assessment   Patient Details  Name: Dorothyann Mourer MRN: [id number] Date of Birth: April 07, 1971  [facility] of [facility]) CM/SW Contact:    Juliane Och, LCSW Phone Number: 02/19/2024, 9:10 AM   Clinical Narrative:  9:10 AM Per chart review, patient has insurance and is followed by Endocrinologist. Patient does not have SNF/HH/DME history. No TOC needs were identified at this time. TOC will continue to follow and be available to assist.  Transition of Care Asessment: Insurance and Status: Insurance coverage has been reviewed Patient has primary care physician: Yes (Endocrinology)   Prior level of function:: N/A Prior/Current Home Services: No current home services Social Drivers of Health Review: SDOH reviewed no interventions necessary Readmission risk has been reviewed: Yes Transition of care needs: no transition of care needs at this time

## 2024-02-19 NOTE — Progress Notes (Signed)
 PHARMACY NOTE:  ANTIMICROBIAL RENAL DOSAGE ADJUSTMENT  Current antimicrobial regimen includes a mismatch between antimicrobial dosage and estimated renal function.  As per policy approved by the Pharmacy & Therapeutics and Medical Executive Committees, the antimicrobial dosage will be adjusted accordingly.  Current antimicrobial dosage:  Merrem 1g IV Q12H  Indication: ESBL infection  Renal Function:  Estimated Creatinine Clearance: 76.4 mL/min (by C-G formula based on SCr of 0.91 mg/dL). []      On intermittent HD, scheduled: []      On CRRT    Antimicrobial dosage has been changed to:  Merrem 1gm IV Q8H  Karis Rilling D. Marikay Show, PharmD, BCPS, BCCCP 02/19/2024, 9:47 AM

## 2024-02-19 NOTE — Progress Notes (Signed)
 Progress Note   Patient: Tamara Hicks [id number] DOB: 11-Apr-1971 DOA: 02/16/2024  DOS: the patient was seen and examined on 02/19/2024   Brief hospital course:  53 y.o. female with medical history significant of hypertension, hypothyroidism, dyslipidemia, remote renal calculi and chronic diarrhea.  Patient was sent to [facility] due to issues related to recurrent UTI with most recent urine culture in the outpatient setting confirming multidrug-resistant organism.    Assessment and Plan:   Acute cystitis with ESBL E. coli - Failed outpatient regimen with continued burning and urgency.  Transition to meropenem with goal of 3 days treatment.  Patient very hesitant about attempting fosfomycin given her recurrence and is amenable to stay for complete IV antibiotic therapy.  Anticipate DC tomorrow.   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  Patient denies any known history of pancreatitis but does complain of chronic diarrhea that has been going on for many years.  Possibility of pancreatic insufficiency leading to diarrhea however unclear at this time.  Patient currently not complaining of any pain nor diarrhea.   Hypertension - Continue home medication regimen.   Chronic diarrhea - Showing improvement last couple months with brat diet.  Used to have constant loose bowels however recently has shown improvement.  Does not feel she has adequate workup in the outpatient setting.  Celiac workup negative.     Hypothyroidism - Synthroid on board.     Subjective: Patient very pleasant this morning.  No acute events overnight.  Denies any fever, chills, chest pain, nausea, vomiting, abdominal pain.  Physical Exam:  Vitals:   02/18/24 1522 02/18/24 1949 02/19/24 0536 02/19/24 0736  BP: (!) 128/98 114/66 131/88 (!) 124/95  Pulse: (!) 105 92 83 84  Resp: 17 18 20 17   Temp: 98.4 F (36.9 C) 97.8 F (36.6 C) 98 F (36.7 C) (!) 97.3 F (36.3 C)   TempSrc: Oral Oral Oral   SpO2: 96% 94% 100% 95%  Weight:      Height:        GENERAL:  Alert, pleasant, no acute distress  HEENT:  EOMI CARDIOVASCULAR:  RRR, no murmurs appreciated RESPIRATORY:  Clear to auscultation, no wheezing, rales, or rhonchi GASTROINTESTINAL:  Soft, nontender, nondistended EXTREMITIES:  No LE edema bilaterally NEURO:  No new focal deficits appreciated SKIN:  No rashes noted PSYCH:  Appropriate mood and affect     Data Reviewed:  No new imaging to review  Previous records (including but not limited to H&P, progress notes, nursing notes, TOC management) were reviewed in assessment of this patient.  Labs: CBC: Recent Labs  Lab 02/16/24 2010 02/17/24 0447 02/18/24 0354 02/19/24 0623  WBC 7.7 7.6 7.4 7.8  NEUTROABS 4.9 4.7  --   --   HGB 13.9 13.3 14.7 15.7*  HCT 42.0 39.4 44.6 46.4*  MCV 91.5 89.3 90.5 89.6  PLT 283 253 293 284   Basic Metabolic Panel: Recent Labs  Lab 02/16/24 2010 02/17/24 0447 02/18/24 0354 02/19/24 0623  NA 141 139 137 137  K 4.3 4.0 4.3 5.1  CL 102 107 103 102  CO2 26 25 24 29   GLUCOSE 87 100* 105* 113*  BUN 15 12 20 20   CREATININE 0.78 0.74 0.94 0.91  CALCIUM 10.2 9.0 9.5 9.7  MG  --  2.4  --   --    Liver Function Tests: Recent Labs  Lab 02/16/24 2010 02/17/24 0447 02/18/24 0354  AST 23 18 19   ALT 22 20  20  ALKPHOS 102 68 74  BILITOT 0.6 1.1 1.1  PROT 7.1 6.0* 6.8  ALBUMIN 4.5 3.4* 3.8   CBG: No results for input(s): "GLUCAP" in the last 168 hours.  Scheduled Meds:  enoxaparin (LOVENOX) injection  40 mg Subcutaneous Q24H   feeding supplement  237 mL Oral TID BM   levothyroxine  50 mcg Oral Q0600   losartan  25 mg Oral Daily   multivitamin with minerals  1 tablet Oral Daily   sodium chloride  flush  3 mL Intravenous Q12H   torsemide  5 mg Oral Daily   Continuous Infusions:  meropenem (MERREM) IV 1 g (02/19/24 1039)   PRN Meds:.acetaminophen  **OR** acetaminophen , melatonin, ondansetron   (ZOFRAN ) IV  Family Communication: None at bedside  Disposition: Status is: Inpatient Remains inpatient appropriate because: ESBL requiring IV antibiotics     Time spent: 33 minutes  Length of inpatient stay: 2 days  Author: Jodeane Mulligan, DO 02/19/2024 1:49 PM  For on call review [email or web address].

## 2024-02-20 DIAGNOSIS — Z1612 Extended spectrum beta lactamase (ESBL) resistance: Secondary | ICD-10-CM | POA: Diagnosis not present

## 2024-02-20 DIAGNOSIS — B9620 Unspecified Escherichia coli [E. coli] as the cause of diseases classified elsewhere: Secondary | ICD-10-CM | POA: Diagnosis not present

## 2024-02-20 DIAGNOSIS — N390 Urinary tract infection, site not specified: Secondary | ICD-10-CM | POA: Diagnosis not present

## 2024-02-20 MED ORDER — FOSFOMYCIN TROMETHAMINE 3 G PO PACK
3.0000 g | PACK | Freq: Once | ORAL | Status: AC
  Administered 2024-02-20: 3 g via ORAL
  Filled 2024-02-20: qty 3

## 2024-02-20 MED ORDER — FOSFOMYCIN TROMETHAMINE 3 G PO PACK: 3.0000 g | PACK | Freq: Once | ORAL | 3 refills | Status: AC

## 2024-02-20 MED ORDER — PHENAZOPYRIDINE HCL 95 MG PO TABS: 95.0000 mg | ORAL_TABLET | Freq: Three times a day (TID) | ORAL | 0 refills | Status: DC | PRN

## 2024-02-20 NOTE — TOC Transition Note (Signed)
 Transition of Care [facility]) - Discharge Note   Patient Details  Name: Tamara Hicks MRN: [id number] Date of Birth: Oct 05, 1970  [facility]) CM/SW Contact:  Jeani Mill, RN Phone Number: 02/20/2024, 12:44 PM   Clinical Narrative:    [name] is stable to discharge home.  No TOC needs at this time.    Final next level of care: Home/Self Care Barriers to Discharge: Barriers Resolved   Patient Goals and CMS Choice Patient states their goals for this hospitalization and ongoing recovery are:: return home          Discharge Placement  home                     Discharge Plan and Services Additional resources added to the After Visit Summary for                                       Social Drivers of Health (SDOH) Interventions SDOH Screenings   Food Insecurity: No Food Insecurity (02/17/2024)  Housing: Low Risk  (02/17/2024)  Transportation Needs: No Transportation Needs (02/17/2024)  Utilities: Not At Risk (02/17/2024)  Financial Resource Strain: Low Risk  (12/13/2021)   Received from [facility]  Physical Activity: Unknown (12/13/2021)   Received from [facility]  Social Connections: Unknown (12/16/2022)   Received from [facility]  Stress: Stress Concern Present (12/13/2021)   Received from [facility]  Tobacco Use: Medium Risk (02/16/2024)     Readmission Risk Interventions    02/20/2024   12:44 PM  Readmission Risk Prevention Plan  Post Dischage Appt Complete  Medication Screening Complete  Transportation Screening Complete
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
## 2024-02-20 NOTE — Plan of Care (Signed)

## 2024-02-25 NOTE — Progress Notes (Unsigned)
 Tamara Hicks

## 2024-02-25 NOTE — Patient Instructions (Incomplete)
 Environmental allergies Start environmental control measures as below. Use over the counter antihistamines such as Zyrtec  (cetirizine ), Claritin (loratadine), Allegra (fexofenadine), or Xyzal (levocetirizine) daily as needed. May take twice a day during allergy  flares. May switch antihistamines every few months.  Use Flonase  (fluticasone ) nasal spray 1-2 sprays per nostril once a day as needed for nasal congestion.  Nasal saline spray (i.e., Simply Saline) or nasal saline lavage (i.e., NeilMed) is recommended as needed and prior to medicated nasal sprays.  Foods Continue to avoid foods that are bothersome. Discussed with patient that skin prick testing and bloodwork (food IgE levels) checks for IgE mediated reactions which her clinical presentation does not support.  Keep a food journal with symptoms and foods eaten. There is no testing for preservatives.   Allergy : food allergy  is when you have eaten a food, developed an allergic reaction after eating the food and have IgE to the food (positive food testing either by skin testing or blood testing).  Food allergy  could lead to life threatening symptoms Sensitivity: occurs when you have IgE to a food (positive food testing either by skin testing or blood testing) but is a food you eat without any issues.  This is not an allergy  and we recommend keeping the food in the diet Intolerance: this is when you have negative testing by either skin testing or blood testing thus not allergic but the food causes symptoms (like belly pain, bloating, diarrhea etc) with ingestion.  These foods should be avoided to prevent symptoms.     Follow up with Dr. Lydia Sams in 1 year or sooner if needed.   Reducing Pollen Exposure Pollen seasons: trees (spring), grass (summer) and ragweed/weeds (fall). Keep windows closed in your home and car to lower pollen exposure.  Install air conditioning in the bedroom and throughout the house if possible.  Avoid going out in dry  windy days - especially early morning. Pollen counts are highest between 5 - 10 AM and on dry, hot and windy days.  Save outside activities for late afternoon or after a heavy rain, when pollen levels are lower.  Avoid mowing of grass if you have grass pollen allergy . Be aware that pollen can also be transported indoors on people and pets.  Dry your clothes in an automatic dryer rather than hanging them outside where they might collect pollen.  Rinse hair and eyes before bedtime.

## 2024-02-26 ENCOUNTER — Ambulatory Visit: Admitting: Allergy

## 2024-02-26 ENCOUNTER — Encounter: Payer: Self-pay | Admitting: Allergy

## 2024-02-26 ENCOUNTER — Other Ambulatory Visit: Payer: Self-pay

## 2024-02-26 VITALS — BP 130/80 | HR 78 | Temp 98.2°F | Resp 18 | Ht 63.0 in | Wt 207.7 lb

## 2024-02-26 DIAGNOSIS — J3089 Other allergic rhinitis: Secondary | ICD-10-CM

## 2024-02-26 DIAGNOSIS — Z713 Dietary counseling and surveillance: Secondary | ICD-10-CM | POA: Diagnosis not present
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
## 2024-04-22 ENCOUNTER — Emergency Department (HOSPITAL_COMMUNITY)

## 2024-04-22 ENCOUNTER — Inpatient Hospital Stay (HOSPITAL_COMMUNITY)
Admission: EM | Admit: 2024-04-22 | Discharge: 2024-04-26 | DRG: 982 | Disposition: A | Discharge status: Rehabilitation Facility | Attending: Surgery | Admitting: Surgery

## 2024-04-22 ENCOUNTER — Encounter (HOSPITAL_COMMUNITY): Payer: Self-pay | Admitting: General Surgery

## 2024-04-22 ENCOUNTER — Observation Stay (HOSPITAL_COMMUNITY)

## 2024-04-22 ENCOUNTER — Encounter (HOSPITAL_COMMUNITY): Admission: EM | Disposition: A | Discharge status: Rehabilitation Facility | Payer: Self-pay | Source: Home / Self Care

## 2024-04-22 ENCOUNTER — Other Ambulatory Visit: Payer: Self-pay

## 2024-04-22 DIAGNOSIS — Y92410 Unspecified street and highway as the place of occurrence of the external cause: Secondary | ICD-10-CM

## 2024-04-22 DIAGNOSIS — S2242XA Multiple fractures of ribs, left side, initial encounter for closed fracture: Principal | ICD-10-CM | POA: Diagnosis present

## 2024-04-22 DIAGNOSIS — S52502A Unspecified fracture of the lower end of left radius, initial encounter for closed fracture: Secondary | ICD-10-CM

## 2024-04-22 DIAGNOSIS — S2241XA Multiple fractures of ribs, right side, initial encounter for closed fracture: Secondary | ICD-10-CM

## 2024-04-22 DIAGNOSIS — S2249XA Multiple fractures of ribs, unspecified side, initial encounter for closed fracture: Secondary | ICD-10-CM | POA: Diagnosis present

## 2024-04-22 DIAGNOSIS — E669 Obesity, unspecified: Secondary | ICD-10-CM | POA: Diagnosis present

## 2024-04-22 DIAGNOSIS — Z881 Allergy status to other antibiotic agents status: Secondary | ICD-10-CM

## 2024-04-22 DIAGNOSIS — Z87442 Personal history of urinary calculi: Secondary | ICD-10-CM

## 2024-04-22 DIAGNOSIS — E039 Hypothyroidism, unspecified: Secondary | ICD-10-CM | POA: Diagnosis present

## 2024-04-22 DIAGNOSIS — R0781 Pleurodynia: Secondary | ICD-10-CM | POA: Diagnosis not present

## 2024-04-22 DIAGNOSIS — Z8711 Personal history of peptic ulcer disease: Secondary | ICD-10-CM

## 2024-04-22 DIAGNOSIS — R1031 Right lower quadrant pain: Secondary | ICD-10-CM | POA: Diagnosis present

## 2024-04-22 DIAGNOSIS — S52572A Other intraarticular fracture of lower end of left radius, initial encounter for closed fracture: Secondary | ICD-10-CM | POA: Diagnosis present

## 2024-04-22 DIAGNOSIS — Z87891 Personal history of nicotine dependence: Secondary | ICD-10-CM

## 2024-04-22 DIAGNOSIS — Z79890 Hormone replacement therapy (postmenopausal): Secondary | ICD-10-CM

## 2024-04-22 DIAGNOSIS — Z860100 Personal history of colon polyps, unspecified: Secondary | ICD-10-CM

## 2024-04-22 DIAGNOSIS — S42002A Fracture of unspecified part of left clavicle, initial encounter for closed fracture: Secondary | ICD-10-CM | POA: Diagnosis not present

## 2024-04-22 DIAGNOSIS — S42022A Displaced fracture of shaft of left clavicle, initial encounter for closed fracture: Secondary | ICD-10-CM | POA: Diagnosis present

## 2024-04-22 DIAGNOSIS — Z8249 Family history of ischemic heart disease and other diseases of the circulatory system: Secondary | ICD-10-CM

## 2024-04-22 DIAGNOSIS — Z6835 Body mass index (BMI) 35.0-35.9, adult: Secondary | ICD-10-CM

## 2024-04-22 DIAGNOSIS — Z79899 Other long term (current) drug therapy: Secondary | ICD-10-CM

## 2024-04-22 DIAGNOSIS — M25561 Pain in right knee: Secondary | ICD-10-CM | POA: Diagnosis present

## 2024-04-22 DIAGNOSIS — E559 Vitamin D deficiency, unspecified: Secondary | ICD-10-CM | POA: Diagnosis present

## 2024-04-22 DIAGNOSIS — Z882 Allergy status to sulfonamides status: Secondary | ICD-10-CM

## 2024-04-22 HISTORY — PX: ORIF CLAVICULAR FRACTURE: SHX5055

## 2024-04-22 HISTORY — PX: OPEN REDUCTION INTERNAL FIXATION (ORIF) DISTAL RADIAL FRACTURE: SHX5989

## 2024-04-22 LAB — COMPREHENSIVE METABOLIC PANEL WITH GFR
ALT: 43 U/L (ref 0–44)
AST: 48 U/L — ABNORMAL HIGH (ref 15–41)
Albumin: 3.9 g/dL (ref 3.5–5.0)
Alkaline Phosphatase: 67 U/L (ref 38–126)
Anion gap: 18 — ABNORMAL HIGH (ref 5–15)
BUN: 16 mg/dL (ref 6–20)
CO2: 15 mmol/L — ABNORMAL LOW (ref 22–32)
Calcium: 9.5 mg/dL (ref 8.9–10.3)
Chloride: 107 mmol/L (ref 98–111)
Creatinine, Ser: 0.77 mg/dL (ref 0.44–1.00)
GFR, Estimated: >60 mL/min (ref >60)
Glucose, Bld: 99 mg/dL (ref 70–99)
Potassium: 3.4 mmol/L — ABNORMAL LOW (ref 3.5–5.1)
Sodium: 140 mmol/L (ref 135–145)
Total Bilirubin: <0.2 mg/dL (ref 0.0–1.2)
Total Protein: 6.8 g/dL (ref 6.5–8.1)

## 2024-04-22 LAB — CBC WITH DIFFERENTIAL/PLATELET
Abs Immature Granulocytes: 0.1 K/uL — ABNORMAL HIGH (ref 0.00–0.07)
Basophils Absolute: 0 K/uL (ref 0.0–0.1)
Basophils Relative: 0 %
Eosinophils Absolute: 0 K/uL (ref 0.0–0.5)
Eosinophils Relative: 0 %
HCT: 43.9 % (ref 36.0–46.0)
Hemoglobin: 14.6 g/dL (ref 12.0–15.0)
Immature Granulocytes: 1 %
Lymphocytes Relative: 9 %
Lymphs Abs: 1.1 K/uL (ref 0.7–4.0)
MCH: 30.2 pg (ref 26.0–34.0)
MCHC: 33.3 g/dL (ref 30.0–36.0)
MCV: 90.9 fL (ref 80.0–100.0)
Monocytes Absolute: 0.5 K/uL (ref 0.1–1.0)
Monocytes Relative: 4 %
Neutro Abs: 10.4 K/uL — ABNORMAL HIGH (ref 1.7–7.7)
Neutrophils Relative %: 86 %
Platelets: 273 K/uL (ref 150–400)
RBC: 4.83 MIL/uL (ref 3.87–5.11)
RDW: 11.9 % (ref 11.5–15.5)
WBC: 12.1 K/uL — ABNORMAL HIGH (ref 4.0–10.5)
nRBC: 0 % (ref 0.0–0.2)

## 2024-04-22 LAB — I-STAT CHEM 8, ED
BUN: 19 mg/dL (ref 6–20)
Calcium, Ion: 1.23 mmol/L (ref 1.15–1.40)
Chloride: 101 mmol/L (ref 98–111)
Creatinine, Ser: 0.9 mg/dL (ref 0.44–1.00)
Glucose, Bld: 99 mg/dL (ref 70–99)
HCT: 43 % (ref 36.0–46.0)
Hemoglobin: 14.6 g/dL (ref 12.0–15.0)
Potassium: 3.3 mmol/L — ABNORMAL LOW (ref 3.5–5.1)
Sodium: 143 mmol/L (ref 135–145)
TCO2: 25 mmol/L (ref 22–32)

## 2024-04-22 LAB — ETHANOL: Alcohol, Ethyl (B): <15 mg/dL (ref <15)

## 2024-04-22 SURGERY — OPEN REDUCTION INTERNAL FIXATION (ORIF) DISTAL RADIUS FRACTURE
Anesthesia: General | Laterality: Left

## 2024-04-22 MED ORDER — MIDAZOLAM HCL 2 MG/2ML IJ SOLN
INTRAMUSCULAR | Status: AC
  Administered 2024-04-22: 1 mg via INTRAVENOUS
  Filled 2024-04-22: qty 2

## 2024-04-22 MED ORDER — FENTANYL CITRATE PF 50 MCG/ML IJ SOSY
100.0000 ug | PREFILLED_SYRINGE | Freq: Once | INTRAMUSCULAR | Status: AC
  Administered 2024-04-22: 100 ug via INTRAVENOUS
  Filled 2024-04-22: qty 2

## 2024-04-22 MED ORDER — CHLORHEXIDINE GLUCONATE 0.12 % MT SOLN: 15.0000 mL | Freq: Once | OROMUCOSAL | Status: AC

## 2024-04-22 MED ORDER — FENTANYL CITRATE (PF) 100 MCG/2ML IJ SOLN
INTRAMUSCULAR | Status: AC
  Administered 2024-04-22: 50 ug via INTRAVENOUS
  Filled 2024-04-22: qty 2

## 2024-04-22 MED ORDER — ORAL CARE MOUTH RINSE: 15.0000 mL | Freq: Once | OROMUCOSAL | Status: AC

## 2024-04-22 MED ORDER — OXYCODONE HCL 5 MG PO TABS
10.0000 mg | ORAL_TABLET | ORAL | Status: DC | PRN
  Administered 2024-04-22 – 2024-04-23 (×2): 10 mg via ORAL
  Filled 2024-04-22 (×2): qty 2

## 2024-04-22 MED ORDER — MESALAMINE 1.2 G PO TBEC: 1.2000 g | DELAYED_RELEASE_TABLET | Freq: Every day | ORAL | Status: DC | PRN

## 2024-04-22 MED ORDER — FENTANYL CITRATE (PF) 100 MCG/2ML IJ SOLN: 50.0000 ug | Freq: Once | INTRAMUSCULAR | Status: AC

## 2024-04-22 MED ORDER — PROPOFOL 10 MG/ML IV BOLUS
INTRAVENOUS | Status: DC | PRN
  Administered 2024-04-22: 180 mg via INTRAVENOUS

## 2024-04-22 MED ORDER — POVIDONE-IODINE 10 % EX SWAB
2.0000 | Freq: Once | CUTANEOUS | Status: AC
  Administered 2024-04-22: 2 via TOPICAL

## 2024-04-22 MED ORDER — LIDOCAINE 2% (20 MG/ML) 5 ML SYRINGE
INTRAMUSCULAR | Status: DC | PRN
  Administered 2024-04-22: 80 mg via INTRAVENOUS

## 2024-04-22 MED ORDER — LIDOCAINE 2% (20 MG/ML) 5 ML SYRINGE
INTRAMUSCULAR | Status: AC
Start: 2024-04-22 — End: 2024-04-22
  Filled 2024-04-22: qty 5

## 2024-04-22 MED ORDER — POLYETHYLENE GLYCOL 3350 17 G PO PACK
17.0000 g | PACK | Freq: Every day | ORAL | Status: DC | PRN
Start: 2024-04-22 — End: 2024-04-25

## 2024-04-22 MED ORDER — ONDANSETRON HCL 4 MG/2ML IJ SOLN
INTRAMUSCULAR | Status: DC | PRN
  Administered 2024-04-22: 4 mg via INTRAVENOUS

## 2024-04-22 MED ORDER — CEFAZOLIN SODIUM-DEXTROSE 2-4 GM/100ML-% IV SOLN
2.0000 g | Freq: Three times a day (TID) | INTRAVENOUS | Status: AC
  Administered 2024-04-22 – 2024-04-23 (×3): 2 g via INTRAVENOUS
  Filled 2024-04-22 (×3): qty 100

## 2024-04-22 MED ORDER — ONDANSETRON 4 MG PO TBDP
4.0000 mg | ORAL_TABLET | Freq: Every day | ORAL | Status: DC | PRN
  Administered 2024-04-25: 4 mg via ORAL
  Filled 2024-04-22: qty 1

## 2024-04-22 MED ORDER — HYDROMORPHONE HCL 1 MG/ML IJ SOLN
0.2500 mg | INTRAMUSCULAR | Status: DC | PRN
  Administered 2024-04-22: 0.5 mg via INTRAVENOUS

## 2024-04-22 MED ORDER — SODIUM CHLORIDE 0.9 % IV SOLN: 250.0000 mL | INTRAVENOUS | Status: AC | PRN

## 2024-04-22 MED ORDER — LORAZEPAM 0.5 MG PO TABS
0.5000 mg | ORAL_TABLET | Freq: Every day | ORAL | Status: DC | PRN
  Administered 2024-04-22: 0.5 mg via ORAL
  Filled 2024-04-22: qty 1

## 2024-04-22 MED ORDER — SUGAMMADEX SODIUM 200 MG/2ML IV SOLN
INTRAVENOUS | Status: DC | PRN
  Administered 2024-04-22: 200 mg via INTRAVENOUS

## 2024-04-22 MED ORDER — PHENYLEPHRINE 80 MCG/ML (10ML) SYRINGE FOR IV PUSH (FOR BLOOD PRESSURE SUPPORT)
PREFILLED_SYRINGE | INTRAVENOUS | Status: DC | PRN
  Administered 2024-04-22: 120 ug via INTRAVENOUS

## 2024-04-22 MED ORDER — MIDAZOLAM HCL 2 MG/2ML IJ SOLN: 1.0000 mg | Freq: Once | INTRAMUSCULAR | Status: AC

## 2024-04-22 MED ORDER — MORPHINE SULFATE (PF) 4 MG/ML IV SOLN
4.0000 mg | Freq: Once | INTRAVENOUS | Status: AC
  Administered 2024-04-22: 4 mg via INTRAVENOUS
  Filled 2024-04-22: qty 1

## 2024-04-22 MED ORDER — ONDANSETRON HCL 4 MG/2ML IJ SOLN: 4.0000 mg | Freq: Once | INTRAMUSCULAR | Status: DC | PRN

## 2024-04-22 MED ORDER — LACTATED RINGERS IV SOLN: INTRAVENOUS | Status: DC

## 2024-04-22 MED ORDER — ROCURONIUM BROMIDE 10 MG/ML (PF) SYRINGE
PREFILLED_SYRINGE | INTRAVENOUS | Status: DC | PRN
  Administered 2024-04-22: 10 mg via INTRAVENOUS
  Administered 2024-04-22: 70 mg via INTRAVENOUS

## 2024-04-22 MED ORDER — METHOCARBAMOL 500 MG PO TABS
500.0000 mg | ORAL_TABLET | Freq: Three times a day (TID) | ORAL | Status: DC
  Administered 2024-04-22 – 2024-04-23 (×3): 500 mg via ORAL
  Filled 2024-04-22 (×4): qty 1

## 2024-04-22 MED ORDER — IOHEXOL 350 MG/ML SOLN
75.0000 mL | Freq: Once | INTRAVENOUS | Status: AC | PRN
  Administered 2024-04-22: 75 mL via INTRAVENOUS

## 2024-04-22 MED ORDER — LIDOCAINE-EPINEPHRINE (PF) 2 %-1:200000 IJ SOLN
10.0000 mL | Freq: Once | INTRAMUSCULAR | Status: AC
  Administered 2024-04-22: 10 mL via INTRADERMAL
  Filled 2024-04-22: qty 20

## 2024-04-22 MED ORDER — LIDOCAINE 5 % EX PTCH
1.0000 | MEDICATED_PATCH | CUTANEOUS | Status: DC
  Administered 2024-04-22 – 2024-04-26 (×5): 1 via TRANSDERMAL
  Filled 2024-04-22 (×5): qty 1

## 2024-04-22 MED ORDER — FENTANYL CITRATE PF 50 MCG/ML IJ SOSY
50.0000 ug | PREFILLED_SYRINGE | Freq: Once | INTRAMUSCULAR | Status: AC
  Administered 2024-04-22: 50 ug via INTRAVENOUS
  Filled 2024-04-22: qty 1

## 2024-04-22 MED ORDER — LOSARTAN POTASSIUM 25 MG PO TABS
25.0000 mg | ORAL_TABLET | Freq: Every day | ORAL | Status: DC
  Administered 2024-04-23 – 2024-04-26 (×4): 25 mg via ORAL
  Filled 2024-04-22 (×4): qty 1

## 2024-04-22 MED ORDER — PANTOPRAZOLE SODIUM 40 MG PO TBEC
40.0000 mg | DELAYED_RELEASE_TABLET | Freq: Every day | ORAL | Status: DC
  Administered 2024-04-23 – 2024-04-26 (×4): 40 mg via ORAL
  Filled 2024-04-22 (×4): qty 1

## 2024-04-22 MED ORDER — PHENYLEPHRINE 80 MCG/ML (10ML) SYRINGE FOR IV PUSH (FOR BLOOD PRESSURE SUPPORT)
PREFILLED_SYRINGE | INTRAVENOUS | Status: AC
  Filled 2024-04-22: qty 10

## 2024-04-22 MED ORDER — DOCUSATE SODIUM 100 MG PO CAPS
100.0000 mg | ORAL_CAPSULE | Freq: Two times a day (BID) | ORAL | Status: DC
  Administered 2024-04-22 – 2024-04-26 (×8): 100 mg via ORAL
  Filled 2024-04-22 (×8): qty 1

## 2024-04-22 MED ORDER — CYCLOBENZAPRINE HCL 10 MG PO TABS
10.0000 mg | ORAL_TABLET | Freq: Two times a day (BID) | ORAL | Status: DC | PRN
  Administered 2024-04-22: 10 mg via ORAL
  Filled 2024-04-22: qty 1

## 2024-04-22 MED ORDER — BUPIVACAINE LIPOSOME 1.3 % IJ SUSP
INTRAMUSCULAR | Status: DC | PRN
  Administered 2024-04-22: 10 mL via PERINEURAL

## 2024-04-22 MED ORDER — HYDRALAZINE HCL 20 MG/ML IJ SOLN: 10.0000 mg | INTRAMUSCULAR | Status: DC | PRN

## 2024-04-22 MED ORDER — ACETAMINOPHEN 500 MG PO TABS
1000.0000 mg | ORAL_TABLET | Freq: Four times a day (QID) | ORAL | Status: DC
  Administered 2024-04-22 – 2024-04-26 (×15): 1000 mg via ORAL
  Filled 2024-04-22 (×17): qty 2

## 2024-04-22 MED ORDER — DEXAMETHASONE SODIUM PHOSPHATE 10 MG/ML IJ SOLN
INTRAMUSCULAR | Status: DC | PRN
  Administered 2024-04-22: 10 mg via INTRAVENOUS

## 2024-04-22 MED ORDER — PANTOPRAZOLE SODIUM 40 MG IV SOLR: 40.0000 mg | Freq: Every day | INTRAVENOUS | Status: DC

## 2024-04-22 MED ORDER — MORPHINE SULFATE (PF) 4 MG/ML IV SOLN: 4.0000 mg | INTRAVENOUS | Status: DC | PRN

## 2024-04-22 MED ORDER — CHLORHEXIDINE GLUCONATE 0.12 % MT SOLN
OROMUCOSAL | Status: AC
  Administered 2024-04-22: 15 mL via OROMUCOSAL
  Filled 2024-04-22: qty 15

## 2024-04-22 MED ORDER — METOPROLOL TARTRATE 5 MG/5ML IV SOLN: 5.0000 mg | Freq: Four times a day (QID) | INTRAVENOUS | Status: DC | PRN

## 2024-04-22 MED ORDER — ROCURONIUM BROMIDE 10 MG/ML (PF) SYRINGE
PREFILLED_SYRINGE | INTRAVENOUS | Status: AC
  Filled 2024-04-22: qty 10

## 2024-04-22 MED ORDER — LEVOTHYROXINE SODIUM 50 MCG PO TABS
50.0000 ug | ORAL_TABLET | Freq: Every day | ORAL | Status: DC
  Administered 2024-04-22 – 2024-04-26 (×5): 50 ug via ORAL
  Filled 2024-04-22 (×2): qty 1
  Filled 2024-04-22: qty 2
  Filled 2024-04-22 (×2): qty 1

## 2024-04-22 MED ORDER — OXYCODONE-ACETAMINOPHEN 5-325 MG PO TABS
2.0000 | ORAL_TABLET | Freq: Once | ORAL | Status: AC
  Administered 2024-04-22: 2 via ORAL
  Filled 2024-04-22: qty 2

## 2024-04-22 MED ORDER — FENTANYL CITRATE (PF) 250 MCG/5ML IJ SOLN
INTRAMUSCULAR | Status: AC
  Filled 2024-04-22: qty 5

## 2024-04-22 MED ORDER — OXYCODONE HCL 5 MG PO TABS: 5.0000 mg | ORAL_TABLET | Freq: Once | ORAL | Status: AC | PRN

## 2024-04-22 MED ORDER — OXYCODONE HCL 5 MG/5ML PO SOLN
ORAL | Status: AC
  Filled 2024-04-22: qty 5

## 2024-04-22 MED ORDER — ONDANSETRON HCL 4 MG/2ML IJ SOLN
4.0000 mg | Freq: Once | INTRAMUSCULAR | Status: AC
  Administered 2024-04-22: 4 mg via INTRAVENOUS
  Filled 2024-04-22: qty 2

## 2024-04-22 MED ORDER — PROPOFOL 10 MG/ML IV BOLUS
INTRAVENOUS | Status: AC
  Filled 2024-04-22: qty 20

## 2024-04-22 MED ORDER — SODIUM CHLORIDE 0.9% FLUSH: 3.0000 mL | INTRAVENOUS | Status: DC | PRN

## 2024-04-22 MED ORDER — METHOCARBAMOL 1000 MG/10ML IJ SOLN
500.0000 mg | Freq: Three times a day (TID) | INTRAMUSCULAR | Status: DC
  Filled 2024-04-22: qty 10

## 2024-04-22 MED ORDER — ENOXAPARIN SODIUM 30 MG/0.3ML IJ SOSY
30.0000 mg | PREFILLED_SYRINGE | Freq: Two times a day (BID) | INTRAMUSCULAR | Status: DC
  Administered 2024-04-23 – 2024-04-26 (×7): 30 mg via SUBCUTANEOUS
  Filled 2024-04-22 (×7): qty 0.3

## 2024-04-22 MED ORDER — CHLORHEXIDINE GLUCONATE 4 % EX SOLN
60.0000 mL | Freq: Once | CUTANEOUS | Status: DC
  Filled 2024-04-22: qty 60

## 2024-04-22 MED ORDER — HYDROMORPHONE HCL 1 MG/ML IJ SOLN
INTRAMUSCULAR | Status: AC
  Filled 2024-04-22: qty 1

## 2024-04-22 MED ORDER — TORSEMIDE 20 MG PO TABS
10.0000 mg | ORAL_TABLET | Freq: Every day | ORAL | Status: DC | PRN
Start: 2024-04-22 — End: 2024-04-26

## 2024-04-22 MED ORDER — OXYCODONE HCL 5 MG/5ML PO SOLN
5.0000 mg | Freq: Once | ORAL | Status: AC | PRN
  Administered 2024-04-22: 5 mg via ORAL

## 2024-04-22 MED ORDER — SODIUM CHLORIDE 0.9% FLUSH
3.0000 mL | Freq: Two times a day (BID) | INTRAVENOUS | Status: DC
  Administered 2024-04-22 – 2024-04-26 (×8): 3 mL via INTRAVENOUS

## 2024-04-22 MED ORDER — OXYCODONE HCL 5 MG PO TABS
5.0000 mg | ORAL_TABLET | ORAL | Status: DC | PRN
  Administered 2024-04-22: 5 mg via ORAL
  Filled 2024-04-22: qty 1

## 2024-04-22 MED ORDER — CEFAZOLIN SODIUM-DEXTROSE 2-4 GM/100ML-% IV SOLN
2.0000 g | INTRAVENOUS | Status: AC
  Administered 2024-04-22: 2 g via INTRAVENOUS
  Filled 2024-04-22: qty 100

## 2024-04-22 MED ORDER — BUPIVACAINE HCL (PF) 0.5 % IJ SOLN
INTRAMUSCULAR | Status: DC | PRN
Start: 2024-04-22 — End: 2024-04-22
  Administered 2024-04-22: 15 mL

## 2024-04-22 MED ORDER — FENTANYL CITRATE (PF) 250 MCG/5ML IJ SOLN
INTRAMUSCULAR | Status: DC | PRN
  Administered 2024-04-22 (×2): 75 ug via INTRAVENOUS

## 2024-04-22 SURGICAL SUPPLY — 56 items
BAG COUNTER SPONGE SURGICOUNT (BAG) ×2 IMPLANT
BIT DRILL 2.2 SS TIBIAL (BIT) IMPLANT
BIT DRILL 2.3 QUICK RELEASE (BIT) IMPLANT
BIT DRILL 2.8 QUICK RELEASE (BIT) IMPLANT
BNDG COMPR ESMARK 4X3 LF (GAUZE/BANDAGES/DRESSINGS) IMPLANT
BNDG ELASTIC 4X5.8 VLCR STR LF (GAUZE/BANDAGES/DRESSINGS) ×2 IMPLANT
BRUSH SCRUB EZ PLAIN DRY (MISCELLANEOUS) ×4 IMPLANT
COVER SURGICAL LIGHT HANDLE (MISCELLANEOUS) ×2 IMPLANT
DRAPE C-ARM 42X72 X-RAY (DRAPES) ×2 IMPLANT
DRSG EMULSION OIL 3X3 NADH (GAUZE/BANDAGES/DRESSINGS) ×2 IMPLANT
ELECTRODE REM PT RTRN 9FT ADLT (ELECTROSURGICAL) ×2 IMPLANT
GAUZE SPONGE 4X4 12PLY STRL (GAUZE/BANDAGES/DRESSINGS) ×2 IMPLANT
GLOVE BIO SURGEON STRL SZ7.5 (GLOVE) ×2 IMPLANT
GLOVE BIO SURGEON STRL SZ8 (GLOVE) ×2 IMPLANT
GLOVE BIOGEL PI IND STRL 7.5 (GLOVE) ×2 IMPLANT
GLOVE BIOGEL PI IND STRL 8 (GLOVE) ×2 IMPLANT
GLOVE SURG ORTHO LTX SZ7.5 (GLOVE) ×4 IMPLANT
GOWN STRL REUS W/ TWL LRG LVL3 (GOWN DISPOSABLE) ×4 IMPLANT
GOWN STRL REUS W/ TWL XL LVL3 (GOWN DISPOSABLE) ×2 IMPLANT
KIT BASIN OR (CUSTOM PROCEDURE TRAY) ×2 IMPLANT
KIT TURNOVER KIT B (KITS) ×2 IMPLANT
KWIRE FX5X1.6XNS BN SS (WIRE) IMPLANT
MANIFOLD NEPTUNE II (INSTRUMENTS) IMPLANT
NDL HYPO 25GX1X1/2 BEV (NEEDLE) IMPLANT
NEEDLE HYPO 25GX1X1/2 BEV (NEEDLE) IMPLANT
NS IRRIG 1000ML POUR BTL (IV SOLUTION) ×2 IMPLANT
PACK ORTHO EXTREMITY (CUSTOM PROCEDURE TRAY) ×2 IMPLANT
PAD ARMBOARD POSITIONER FOAM (MISCELLANEOUS) ×4 IMPLANT
PAD CAST 3X4 CTTN HI CHSV (CAST SUPPLIES) ×2 IMPLANT
PEG LOCKING SMOOTH 2.2X14 (Peg) IMPLANT
PEG LOCKING SMOOTH 2.2X16 (Screw) IMPLANT
PEG LOCKING SMOOTH 2.2X18 (Peg) IMPLANT
PEG LOCKING SMOOTH 2.2X20 (Screw) IMPLANT
PLATE BN MN NAR 41X22 CRSLCK (Plate) IMPLANT
PLATE CLAV LOCK 6H SML (Plate) IMPLANT
SCREW 2.7X12MM (Screw) IMPLANT
SCREW BN 14X2.7XNONLOCK 3 LD (Screw) IMPLANT
SCREW BONE LOCK 3.0X10MM HEXA (Screw) IMPLANT
SCREW LOCK 14X2.7X 3 LD TPR (Screw) IMPLANT
SCREW LOCKING 2.7X13MM (Screw) IMPLANT
SCREW NON LOCK 3.0X10 (Screw) IMPLANT
SCREW NON LOCK 3.0X12 (Screw) IMPLANT
SCREW NONLOCK HEX 3.5X12 (Screw) IMPLANT
SPIKE FLUID TRANSFER (MISCELLANEOUS) IMPLANT
STAPLER SKIN PROX 35W (STAPLE) IMPLANT
SUCTION TUBE FRAZIER 10FR DISP (SUCTIONS) ×2 IMPLANT
SUT ETHILON 3 0 PS 1 (SUTURE) IMPLANT
SUT VIC AB 0 CT1 27XBRD ANBCTR (SUTURE) IMPLANT
SUT VIC AB 1 CT1 27XBRD ANBCTR (SUTURE) IMPLANT
SUT VIC AB 2-0 CT1 TAPERPNT 27 (SUTURE) ×2 IMPLANT
SYR CONTROL 10ML LL (SYRINGE) IMPLANT
TOWEL GREEN STERILE (TOWEL DISPOSABLE) ×4 IMPLANT
TOWEL GREEN STERILE FF (TOWEL DISPOSABLE) ×2 IMPLANT
TUBE CONNECTING 12X1/4 (SUCTIONS) ×2 IMPLANT
UNDERPAD 30X36 HEAVY ABSORB (UNDERPADS AND DIAPERS) ×2 IMPLANT
WATER STERILE IRR 1000ML POUR (IV SOLUTION) ×2 IMPLANT
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
## 2024-04-22 NOTE — Anesthesia Procedure Notes (Signed)
 Procedure Name: Intubation Date/Time: 04/22/2024 11:12 AM  Performed by: Roslynn Waddell LABOR, CRNAPre-anesthesia Checklist: Patient identified, Emergency Drugs available, Suction available and Patient being monitored Patient Re-evaluated:Patient Re-evaluated prior to induction Oxygen Delivery Method: Circle System Utilized Preoxygenation: Pre-oxygenation with 100% oxygen Induction Type: IV induction Ventilation: Mask ventilation without difficulty Laryngoscope Size: Mac and 3 Grade View: Grade I Tube type: Oral Tube size: 7.0 mm Number of attempts: 1 Airway Equipment and Method: Stylet and Oral airway Placement Confirmation: ETT inserted through vocal cords under direct vision, positive ETCO2 and breath sounds checked- equal and bilateral Secured at: 21 cm Tube secured with: Tape Dental Injury: Teeth and Oropharynx as per pre-operative assessment  Comments: Atraumatic induction/intubation. Dentition and oral mucosa as per preop.

## 2024-04-22 NOTE — ED Notes (Signed)
 This Nt attempted to walk Pt in the hallway, Pt unable to sit up in the bed, Pt stated the pain in her chest and ribs are too bad to move. MD and RN made aware.
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
## 2024-04-22 NOTE — Transfer of Care (Signed)
 Immediate Anesthesia Transfer of Care Note  Patient: Tamara Hicks  Procedure(s) Performed: OPEN REDUCTION INTERNAL FIXATION (ORIF) DISTAL RADIUS FRACTURE (Left) OPEN REDUCTION INTERNAL FIXATION (ORIF) CLAVICULAR FRACTURE  Patient Location: PACU  Anesthesia Type:General and Regional  Level of Consciousness: awake  Airway & Oxygen Therapy: Patient Spontanous Breathing and Patient connected to face mask oxygen  Post-op Assessment: Report given to RN and Post -op Vital signs reviewed and stable  Post vital signs: Reviewed and stable  Last Vitals:  Vitals Value Taken Time  BP 141/89 04/22/24 13:21  Temp    Pulse 94 04/22/24 13:23  Resp 15 04/22/24 13:23  SpO2 84 % 04/22/24 13:23  Vitals shown include unfiled device data.  Last Pain:  Vitals:   04/22/24 1028  TempSrc:   PainSc: 5          Complications: No notable events documented.

## 2024-04-22 NOTE — Anesthesia Preprocedure Evaluation (Addendum)
 Anesthesia Evaluation  Patient identified by MRN, date of birth, ID band Patient awake    Reviewed: Allergy  & Precautions, NPO status , Patient's Chart, lab work & pertinent test results  Airway Mallampati: II  TM Distance: >3 FB     Dental no notable dental hx. (+) Dental Advisory Given   Pulmonary former smoker   Pulmonary exam normal breath sounds clear to auscultation       Cardiovascular hypertension, Pt. on medications Normal cardiovascular exam Rhythm:Regular Rate:Normal     Neuro/Psych  C-spine cleared  negative psych ROS   GI/Hepatic Neg liver ROS, PUD,GERD  Medicated,,  Endo/Other  Hypothyroidism  Obesity  Renal/GU Hx/o nephrolithiasis   Hx/o UTI's    Musculoskeletal Left distal radius Fx   Abdominal  (+) + obese  Peds  Hematology negative hematology ROS (+)   Anesthesia Other Findings   Reproductive/Obstetrics negative OB ROS                              Anesthesia Physical Anesthesia Plan  ASA: 2  Anesthesia Plan: General   Post-op Pain Management: Dilaudid  IV, Precedex, Ofirmev  IV (intra-op)* and Regional block*   Induction: Intravenous  PONV Risk Score and Plan: 4 or greater and Treatment may vary due to age or medical condition, Midazolam  and Ondansetron   Airway Management Planned: LMA  Additional Equipment: None  Intra-op Plan:   Post-operative Plan: Extubation in OR  Informed Consent: I have reviewed the patients History and Physical, chart, labs and discussed the procedure including the risks, benefits and alternatives for the proposed anesthesia with the patient or authorized representative who has indicated his/her understanding and acceptance.     Dental advisory given  Plan Discussed with: Anesthesiologist and CRNA  Anesthesia Plan Comments:          Anesthesia Quick Evaluation
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
## 2024-04-22 NOTE — Anesthesia Procedure Notes (Signed)
 Anesthesia Regional Block: Interscalene brachial plexus block   Pre-Anesthetic Checklist: , timeout performed,  Correct Patient, Correct Site, Correct Laterality,  Correct Procedure, Correct Position, site marked,  Risks and benefits discussed,  Surgical consent,  Pre-op evaluation,  At surgeon's request and post-op pain management  Laterality: Left  Prep: chloraprep       Needles:  Injection technique: Single-shot  Needle Type: Echogenic Stimulator Needle     Needle Length: 10cm  Needle Gauge: 21   Needle insertion depth: 6 cm   Additional Needles:   Narrative:  Start time: 04/22/2024 10:42 AM End time: 04/22/2024 10:47 AM Injection made incrementally with aspirations every 5 mL.  Performed by: Personally  Anesthesiologist: Jerrye Sharper, MD  Additional Notes: Timeout performed. Patient sedated. Relevant anatomy ID'd using US . Incremental 2-5ml injection of LA with frequent aspiration. Patient tolerated procedure well.

## 2024-04-22 NOTE — Anesthesia Postprocedure Evaluation (Signed)
 Anesthesia Post Note  Patient: Tamara Hicks  Procedure(s) Performed: OPEN REDUCTION INTERNAL FIXATION (ORIF) DISTAL RADIUS FRACTURE (Left) OPEN REDUCTION INTERNAL FIXATION (ORIF) CLAVICULAR FRACTURE     Patient location during evaluation: PACU Anesthesia Type: General Level of consciousness: awake and alert and oriented Pain management: pain level controlled Vital Signs Assessment: post-procedure vital signs reviewed and stable Respiratory status: spontaneous breathing, nonlabored ventilation and respiratory function stable Cardiovascular status: blood pressure returned to baseline and stable Postop Assessment: no apparent nausea or vomiting Anesthetic complications: no   No notable events documented.  Last Vitals:  Vitals:   04/22/24 1330 04/22/24 1345  BP: (!) 142/87 (!) 152/99  Pulse: 88 92  Resp: 10 16  Temp:    SpO2: 100% 96%    Last Pain:  Vitals:   04/22/24 1345  TempSrc:   PainSc: 0-No pain                 Aries Kasa A.
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
## 2024-04-22 NOTE — Progress Notes (Signed)
 Orthopedic Tech Progress Note Patient Details:  Tamara Hicks 07-27-1971 [id number]  Ortho Devices Type of Ortho Device: Arm sling, Sugartong splint Ortho Device/Splint Location: lue Ortho Device/Splint Interventions: Ordered, Adjustment   Post Interventions Patient Tolerated: Well Instructions Provided: Care of device, Adjustment of device  Chandra Dorn PARAS 04/22/2024, 3:48 AM
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
## 2024-04-22 NOTE — Plan of Care (Signed)
  Problem: Acute Rehab PT Goals(only PT should resolve) Goal: Pt will Roll Supine to Side 04/22/2024 1324 by Josepha Cassis, Student-PT Flowsheets (Taken 04/22/2024 1324) Pt will Roll Supine to Side: with min assist 04/22/2024 1324 by Josepha Cassis, Student-PT Outcome: Progressing Goal: Pt Will Go Supine/Side To Sit 04/22/2024 1324 by Josepha Cassis, Student-PT Flowsheets (Taken 04/22/2024 1324) Pt will go Supine/Side to Sit: with minimal assist 04/22/2024 1324 by Josepha Cassis, Student-PT Outcome: Progressing Goal: Patient Will Transfer Sit To/From Stand 04/22/2024 1324 by Josepha Cassis, Student-PT Flowsheets (Taken 04/22/2024 1324) Patient will transfer sit to/from stand: with minimal assist 04/22/2024 1324 by Josepha Cassis, Student-PT Outcome: Progressing Goal: Pt Will Ambulate 04/22/2024 1324 by Josepha Cassis, Student-PT Flowsheets (Taken 04/22/2024 1324) Pt will Ambulate:  100 feet  with least restrictive assistive device  with minimal assist 04/22/2024 1324 by Josepha Cassis, Student-PT Outcome: Progressing Goal: Pt Will Go Up/Down Stairs 04/22/2024 1324 by Josepha Cassis, Student-PT Flowsheets (Taken 04/22/2024 1324) Pt will Go Up / Down Stairs:  with moderate assist  3-5 stairs 04/22/2024 1324 by Josepha Cassis, Student-PT Outcome: Progressing
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
## 2024-04-22 NOTE — ED Notes (Signed)
 Pt reports she did have ativan  and flexeril  tonight with her alcohol beverages

## 2024-04-22 NOTE — Plan of Care (Signed)

## 2024-04-23 DIAGNOSIS — Z87442 Personal history of urinary calculi: Secondary | ICD-10-CM | POA: Diagnosis not present

## 2024-04-23 DIAGNOSIS — Z881 Allergy status to other antibiotic agents status: Secondary | ICD-10-CM | POA: Diagnosis not present

## 2024-04-23 DIAGNOSIS — Z79899 Other long term (current) drug therapy: Secondary | ICD-10-CM | POA: Diagnosis not present

## 2024-04-23 DIAGNOSIS — S52572A Other intraarticular fracture of lower end of left radius, initial encounter for closed fracture: Secondary | ICD-10-CM | POA: Diagnosis present

## 2024-04-23 DIAGNOSIS — E559 Vitamin D deficiency, unspecified: Secondary | ICD-10-CM | POA: Diagnosis present

## 2024-04-23 DIAGNOSIS — R7401 Elevation of levels of liver transaminase levels: Secondary | ICD-10-CM | POA: Diagnosis not present

## 2024-04-23 DIAGNOSIS — R1013 Epigastric pain: Secondary | ICD-10-CM | POA: Diagnosis not present

## 2024-04-23 DIAGNOSIS — T07XXXA Unspecified multiple injuries, initial encounter: Secondary | ICD-10-CM | POA: Diagnosis not present

## 2024-04-23 DIAGNOSIS — R1031 Right lower quadrant pain: Secondary | ICD-10-CM | POA: Diagnosis present

## 2024-04-23 DIAGNOSIS — Z8249 Family history of ischemic heart disease and other diseases of the circulatory system: Secondary | ICD-10-CM | POA: Diagnosis not present

## 2024-04-23 DIAGNOSIS — S42022A Displaced fracture of shaft of left clavicle, initial encounter for closed fracture: Secondary | ICD-10-CM | POA: Diagnosis present

## 2024-04-23 DIAGNOSIS — R0781 Pleurodynia: Secondary | ICD-10-CM | POA: Diagnosis present

## 2024-04-23 DIAGNOSIS — Z8711 Personal history of peptic ulcer disease: Secondary | ICD-10-CM | POA: Diagnosis not present

## 2024-04-23 DIAGNOSIS — Z860100 Personal history of colon polyps, unspecified: Secondary | ICD-10-CM | POA: Diagnosis not present

## 2024-04-23 DIAGNOSIS — Z882 Allergy status to sulfonamides status: Secondary | ICD-10-CM | POA: Diagnosis not present

## 2024-04-23 DIAGNOSIS — S2242XA Multiple fractures of ribs, left side, initial encounter for closed fracture: Secondary | ICD-10-CM | POA: Diagnosis present

## 2024-04-23 DIAGNOSIS — Z79890 Hormone replacement therapy (postmenopausal): Secondary | ICD-10-CM | POA: Diagnosis not present

## 2024-04-23 DIAGNOSIS — Z87891 Personal history of nicotine dependence: Secondary | ICD-10-CM | POA: Diagnosis not present

## 2024-04-23 DIAGNOSIS — E039 Hypothyroidism, unspecified: Secondary | ICD-10-CM | POA: Diagnosis present

## 2024-04-23 DIAGNOSIS — Z4789 Encounter for other orthopedic aftercare: Secondary | ICD-10-CM | POA: Diagnosis not present

## 2024-04-23 DIAGNOSIS — Z6835 Body mass index (BMI) 35.0-35.9, adult: Secondary | ICD-10-CM | POA: Diagnosis not present

## 2024-04-23 DIAGNOSIS — K5900 Constipation, unspecified: Secondary | ICD-10-CM | POA: Diagnosis not present

## 2024-04-23 DIAGNOSIS — E669 Obesity, unspecified: Secondary | ICD-10-CM | POA: Diagnosis present

## 2024-04-23 DIAGNOSIS — M25561 Pain in right knee: Secondary | ICD-10-CM | POA: Diagnosis present

## 2024-04-23 DIAGNOSIS — D62 Acute posthemorrhagic anemia: Secondary | ICD-10-CM | POA: Diagnosis not present

## 2024-04-23 DIAGNOSIS — R1310 Dysphagia, unspecified: Secondary | ICD-10-CM | POA: Diagnosis not present

## 2024-04-23 DIAGNOSIS — S20219A Contusion of unspecified front wall of thorax, initial encounter: Secondary | ICD-10-CM | POA: Diagnosis not present

## 2024-04-23 DIAGNOSIS — Y92410 Unspecified street and highway as the place of occurrence of the external cause: Secondary | ICD-10-CM | POA: Diagnosis not present

## 2024-04-23 LAB — CBC
HCT: 39 % (ref 36.0–46.0)
Hemoglobin: 13.1 g/dL (ref 12.0–15.0)
MCH: 30.4 pg (ref 26.0–34.0)
MCHC: 33.6 g/dL (ref 30.0–36.0)
MCV: 90.5 fL (ref 80.0–100.0)
Platelets: 255 K/uL (ref 150–400)
RBC: 4.31 MIL/uL (ref 3.87–5.11)
RDW: 12 % (ref 11.5–15.5)
WBC: 10.5 K/uL (ref 4.0–10.5)
nRBC: 0 % (ref 0.0–0.2)

## 2024-04-23 LAB — BASIC METABOLIC PANEL WITH GFR
Anion gap: 7 (ref 5–15)
BUN: 8 mg/dL (ref 6–20)
CO2: 26 mmol/L (ref 22–32)
Calcium: 9.4 mg/dL (ref 8.9–10.3)
Chloride: 105 mmol/L (ref 98–111)
Creatinine, Ser: 0.8 mg/dL (ref 0.44–1.00)
GFR, Estimated: >60 mL/min (ref >60)
Glucose, Bld: 116 mg/dL — ABNORMAL HIGH (ref 70–99)
Potassium: 3.5 mmol/L (ref 3.5–5.1)
Sodium: 138 mmol/L (ref 135–145)

## 2024-04-23 LAB — HIV ANTIBODY (ROUTINE TESTING W REFLEX): HIV Screen 4th Generation wRfx: NONREACTIVE

## 2024-04-23 MED ORDER — DICLOFENAC SODIUM 1 % EX GEL
4.0000 g | Freq: Three times a day (TID) | CUTANEOUS | Status: DC
  Administered 2024-04-23 – 2024-04-25 (×9): 4 g via TOPICAL
  Filled 2024-04-23: qty 100

## 2024-04-23 MED ORDER — CYCLOBENZAPRINE HCL 10 MG PO TABS
10.0000 mg | ORAL_TABLET | Freq: Three times a day (TID) | ORAL | Status: DC
  Administered 2024-04-23 – 2024-04-26 (×10): 10 mg via ORAL
  Filled 2024-04-23 (×10): qty 1

## 2024-04-23 MED ORDER — OXYCODONE HCL 5 MG PO TABS
10.0000 mg | ORAL_TABLET | ORAL | Status: DC | PRN
  Administered 2024-04-23: 10 mg via ORAL
  Administered 2024-04-23 – 2024-04-24 (×3): 15 mg via ORAL
  Administered 2024-04-24: 10 mg via ORAL
  Administered 2024-04-25 (×2): 15 mg via ORAL
  Administered 2024-04-25: 10 mg via ORAL
  Administered 2024-04-26 (×3): 15 mg via ORAL
  Filled 2024-04-23: qty 2
  Filled 2024-04-23 (×10): qty 3

## 2024-04-23 MED ORDER — MORPHINE SULFATE (PF) 4 MG/ML IV SOLN
4.0000 mg | INTRAVENOUS | Status: DC | PRN
  Administered 2024-04-23 – 2024-04-24 (×3): 4 mg via INTRAVENOUS
  Filled 2024-04-23 (×3): qty 1

## 2024-04-23 NOTE — TOC Progression Note (Addendum)
 Transition of Care (TOC) - Progression Note   Ordered single point cane , 3 in 1 , and shower seat with [facility] with [employer] [facility]  Patient Details  Name: Tamara Hicks MRN: [id number] Date of Birth: 03/14/1971  Transition of Care [facility]) CM/SW Contact  Brighid Koch, Powell Jansky, RN Phone Number: 04/23/2024, 2:07 PM  Clinical Narrative:       Expected Discharge Plan: OP Rehab Barriers to Discharge: Continued Medical Work up               Expected Discharge Plan and Services       Living arrangements for the past 2 months: Single Family Home                                       Social Drivers of Health (SDOH) Interventions SDOH Screenings   Food Insecurity: No Food Insecurity (04/22/2024)  Housing: Low Risk  (04/22/2024)  Transportation Needs: No Transportation Needs (04/22/2024)  Utilities: Not At Risk (04/22/2024)  Financial Resource Strain: Low Risk  (12/13/2021)   Received from [facility]  Physical Activity: Unknown (12/13/2021)   Received from [facility]  Social Connections: Unknown (12/16/2022)   Received from [facility]  Stress: Stress Concern Present (12/13/2021)   Received from [facility]  Tobacco Use: Medium Risk (04/22/2024)    Readmission Risk Interventions    02/20/2024   12:44 PM  Readmission Risk Prevention Plan  Post Dischage Appt Complete  Medication Screening Complete  Transportation Screening Complete
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
## 2024-04-23 NOTE — TOC CAGE-AID Note (Signed)
 Transition of Care [facility]) - CAGE-AID Screening   Patient Details  Name: Tamara Hicks MRN: [id number] Date of Birth: Apr 06, 1971  [facility]) CM/SW Contact:    Isiah Scheel E Teron Blais, LCSW Phone Number: 04/23/2024, 12:07 PM   Clinical Narrative: Patient states she drinks alcohol 2-4 times per month. Denies other non prescription substance use. Patient denies SA resource needs.   CAGE-AID Screening:    Have You Ever Felt You Ought to Cut Down on Your Drinking or Drug Use?: No Have People Annoyed You By Critizing Your Drinking Or Drug Use?: No Have You Felt Bad Or Guilty About Your Drinking Or Drug Use?: No Have You Ever Had a Drink or Used Drugs First Thing In The Morning to Steady Your Nerves or to Get Rid of a Hangover?: No CAGE-AID Score: 0  Substance Abuse Education Offered: Yes
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
## 2024-04-24 ENCOUNTER — Encounter (HOSPITAL_COMMUNITY): Payer: Self-pay | Admitting: Orthopedic Surgery

## 2024-04-24 NOTE — Telephone Encounter (Signed)
 Tamara Hicks, Dr. Tamela from [facility] is referring patient for evaluation of pancreas lesion noted on imaging, asking that you consider doing an EUS.  Referral has been scanned to the chart and Care Everywhere updated.  Please review and give your recommendations  No medications noted that would require a hold.  Thanks, Camelia CHRISTELLA Oz BSN, RN Interventional Endoscopy Clinical Care Coordinator Office [phone]  \  Fax [phone]  Component 04/23/24 <redacted file path> 04/22/24 <redacted file path> 04/22/24 <redacted file path> 02/19/24 <redacted file path> 02/18/24 <redacted file path> 02/17/24 <redacted file path>  Sodium 138 <redacted file path> 143 <redacted file path> 140 137 <redacted file path> 137 <redacted file path> 139 <redacted file path>  Potassium 3.5 <redacted file path> 3.3 <redacted file path> Low  3.4 Low  5.1 <redacted file path> 4.3 <redacted file path> 4.0 <redacted file path>  Chloride 105 <redacted file path> 101 <redacted file path> 107 102 <redacted file path> 103 <redacted file path> 107 <redacted file path>  CO2 26 <redacted file path> -- 15 Low  29 <redacted file path> 24 <redacted file path> 25 <redacted file path>  Glucose, Bld 116 <redacted file path> High   99 <redacted file path>  99  113 <redacted file path> High   105 <redacted file path> High   100 <redacted file path> High    BUN 8 <redacted file path> 19 <redacted file path> 16 20 <redacted file path> 20 <redacted file path> 12 <redacted file path>  Creatinine, Ser 0.80 <redacted file path> 0.90 <redacted file path> 0.77 0.91 <redacted file path> 0.94 <redacted file path> 0.74 <redacted file path>  Calcium 9.4 <redacted file path> -- 9.5 9.7 <redacted file path> 9.5 <redacted file path> 9.0 <redacted file path>  Total Protein -- -- 6.8 -- 6.8 <redacted file path> 6.0 <redacted file path> Low   Albumin -- -- 3.9 -- 3.8 <redacted file path> 3.4 <redacted file path> Low   AST -- -- 48 High  -- 19  <redacted file path> 18 <redacted file path>  ALT -- -- 43 -- 20 <redacted file path> 20 <redacted file path>  Alkaline Phosphatase -- -- 67 -- 74 <redacted file path> 68 <redacted file path>  Total Bilirubin -- -- <0.2 -- 1.1 <redacted file path> 1.1 <redacted file path>  GFR, Estimated >60 <redacted file path>  -- >60  >60 <redacted file path>  >60 <redacted file path>  >60 <redacted file path>   Anion gap 7 <redacted file path>  -- 18 High   6 <redacted file path>  10 <redacted file path>  7 <redacted file path>        CT Abdomen Pelvis W IV Only Order: [id number] Narrative  CLINICAL DATA:  Status post trauma.  EXAM: CT CHEST, ABDOMEN, AND PELVIS WITH CONTRAST  TECHNIQUE: Multidetector CT imaging of the chest, abdomen and pelvis was performed following the standard protocol during bolus administration of intravenous contrast.  RADIATION DOSE REDUCTION: This exam was performed according to the departmental dose-optimization program which includes automated exposure control, adjustment of the mA and/or kV according to patient size and/or use of iterative reconstruction technique.  CONTRAST:  75mL OMNIPAQUE  IOHEXOL  350 MG/ML SOLN  COMPARISON:  Feb 17, 2024  FINDINGS: CT CHEST FINDINGS  Cardiovascular: No significant vascular findings. A stable 8 mm focus of low attenuation is seen adjacent to a subsegmental lower lobe branch of the right pulmonary artery (axial CT image 30, CT series 5). Normal heart size. No pericardial effusion.  Mediastinum/Nodes: No enlarged mediastinal,  hilar, or axillary lymph nodes. A stable heterogeneous thyroid nodule is seen within the right lobe of the thyroid gland. The trachea and esophagus demonstrate no significant findings.  Lungs/Pleura: A 5 mm right middle lobe pulmonary nodule is seen (axial CT image 62, CT series 7).  Mild lingular and bilateral lower lobe atelectatic changes are seen. No pleural effusion or pneumothorax is  identified.  Musculoskeletal: Acute anterior fourth, fifth, 6 and seventh right rib fractures are seen. An acute nondisplaced fracture of the mid left clavicle is noted.  CT ABDOMEN PELVIS FINDINGS  Hepatobiliary: No focal liver abnormality is seen. No gallstones, gallbladder wall thickening, or biliary dilatation.  Pancreas: A stable 2.8 cm x 3.6 cm cystic structure is seen within the junction of the pancreatic body and tail. There is no evidence of peripancreatic inflammation or pancreatic ductal dilatation.  Spleen: Normal in size without focal abnormality.  Adrenals/Urinary Tract: There is a stable 2.3 cm diameter low attenuation (45.15 Hounsfield units) left adrenal mass. The right adrenal gland is unremarkable. Kidneys are normal in size, without renal calculi or hydronephrosis. Areas of renal cortical scarring are seen throughout the right kidney. Bladder is unremarkable.  Stomach/Bowel: Stomach is within normal limits. Appendix appears normal. No evidence of bowel wall thickening, distention, or inflammatory changes. Noninflamed diverticula are seen scattered throughout the large bowel.  Vascular/Lymphatic: No significant vascular findings are present. A retroaortic left renal vein is seen. No enlarged abdominal or pelvic lymph nodes.  Reproductive: Uterus and bilateral adnexa are unremarkable.  Other: No abdominal wall hernia or abnormality. No abdominopelvic ascites.  Musculoskeletal: No acute or significant osseous findings.  IMPRESSION: 1. Acute anterior fourth, fifth, sixth and seventh right rib fractures. 2. Acute nondisplaced fracture of the mid left clavicle. 3. Stable 5 mm right middle lobe pulmonary nodule. No follow-up needed if patient is low-risk.This recommendation follows the consensus statement: Guidelines for Management of Incidental Pulmonary Nodules Detected on CT Images: From the Fleischner Society 2017; Radiology 2017; 284:228-243. 4.  Stable 2.8 cm x 3.6 cm cystic structure within the junction of the pancreatic body and tail. This is considered to likely represent a pseudocyst after MR abdomen evaluation on Feb 17, 2024. 5. 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 6. Colonic diverticulosis. 7. Stable heterogeneous thyroid nodule within the right lobe of the thyroid gland. Correlation with nonemergent thyroid ultrasound is recommended.   Electronically Signed   By: Suzen Dials M.D.   On: 04/22/2024 03:40 Exam End: 04/22/24 03:14 Last Resulted: 04/22/24 03:40  Received From: [facility]  Result Received: 04/24/24 14:30     MRI Abdomen W And WO Contrast Order: [id number] Narrative  CLINICAL DATA:  Pancreatic cystic lesion  EXAM: MRI ABDOMEN WITHOUT AND WITH CONTRAST (INCLUDING MRCP)  TECHNIQUE: Multiplanar multisequence MR imaging of the abdomen was performed both before and after the administration of intravenous contrast. Heavily T2-weighted images of the biliary and pancreatic ducts were obtained, and three-dimensional MRCP images were rendered by post processing.  CONTRAST:  10mL GADAVIST  GADOBUTROL  1 MMOL/ML IV SOLN  COMPARISON:  CT abdomen pelvis, 02/17/2024, CT chest abdomen pelvis, 01/25/2024  FINDINGS: Lower chest: No acute abnormality.  Hepatobiliary: No solid liver abnormality is seen. Mild hepatic steatosis. No gallstones, gallbladder wall thickening, or biliary dilatation.  Pancreas: Unchanged, lobulated cystic lesion occupying the central pancreatic tail measuring 4.3 x 3.3 cm with layering internal hemorrhagic or proteinaceous contents (series 901, image 53). No acute inflammatory findings. No pancreatic ductal.  Spleen: Normal in size without significant  abnormality.  Adrenals/Urinary Tract: Definitively benign, macroscopic fat containing left adrenal adenoma, for which no further follow-up or characterization is required (series 502, image 54). Normal right adrenal.  Lobulated right renal cortical scarring. Left kidney is normal, without obvious renal calculi, solid lesion, or hydronephrosis.  Stomach/Bowel: Stomach is within normal limits. No evidence of bowel wall thickening, distention, or inflammatory changes.  Vascular/Lymphatic: No significant vascular findings are present. No enlarged abdominal lymph nodes.  Other: No abdominal wall hernia or abnormality. No ascites.  Musculoskeletal: No acute or significant osseous findings.  IMPRESSION: 1. Unchanged, lobulated cystic lesion occupying the central pancreatic tail measuring 4.3 x 3.3 cm with layering internal hemorrhagic or proteinaceous contents. No pancreatic ductal dilatation. Given size and contents, this is again most likely a pseudocyst, although could reflect a large IPMN. Given size and patient age, consider EUS/FNA for definitive characterization. 2. Definitively benign, macroscopic fat containing left adrenal adenoma, for which no further follow-up or characterization is required. 3. Mild hepatic steatosis.   Electronically Signed   By: Marolyn JONETTA Jaksch M.D.   On: 02/17/2024 19:01 Exam End: 02/17/24 18:22 Last Resulted: 02/17/24 19:01  Received From: [place]  Result Received: 04/24/24 14:30

## 2024-04-24 NOTE — Plan of Care (Signed)
  Problem: Education: Goal: Knowledge of General Education information will improve Description: Including pain rating scale, medication(s)/side effects and non-pharmacologic comfort measures Outcome: Progressing   Problem: Activity: Goal: Risk for activity intolerance will decrease Outcome: Progressing   Problem: Nutrition: Goal: Adequate nutrition will be maintained Outcome: Progressing   Problem: Coping: Goal: Level of anxiety will decrease Outcome: Progressing   Problem: Pain Managment: Goal: General experience of comfort will improve and/or be controlled Outcome: Progressing   Problem: Skin Integrity: Goal: Risk for impaired skin integrity will decrease Outcome: Progressing

## 2024-04-24 NOTE — Progress Notes (Signed)
 Mobility Specialist Progress Note:   04/24/24 1100  Mobility  Activity Ambulated with assistance  Level of Assistance Contact guard assist, steadying assist  Assistive Device Cane  Distance Ambulated (ft) 25 ft  LUE Weight Bearing Per Provider Order NWB  Activity Response Tolerated well  Mobility Referral Yes  Mobility visit 1 Mobility  Mobility Specialist Start Time (ACUTE ONLY) 1100  Mobility Specialist Stop Time (ACUTE ONLY) 1120  Mobility Specialist Time Calculation (min) (ACUTE ONLY) 20 min   Pt agreeable to mobility session. C/o significant bilateral rib pain. Able to ambulate in room with cane. Distance limited by extreme pain. Agreeable to sit in chair, left with all needs met.   Therisa Rana Mobility Specialist Please contact via SecureChat or  Rehab office at [phone]

## 2024-04-24 NOTE — Telephone Encounter (Signed)
 Referral for Pancreatic Lesion EUS

## 2024-04-24 NOTE — Progress Notes (Signed)
Inpatient Rehab Admissions Coordinator:  ° °Per therapy recommendation,  patient was screened for CIR candidacy by Ltanya Bayley, MS, CCC-SLP. At this time, Pt. Appears to be a a potential candidate for CIR. I will place   order for rehab consult per protocol for full assessment. Please contact me any with questions. ° °Jacy Brocker, MS, CCC-SLP °Rehab Admissions Coordinator  °[phone] (celll) °[phone] (office) ° °

## 2024-04-24 NOTE — Plan of Care (Signed)
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
## 2024-04-25 MED ORDER — POLYETHYLENE GLYCOL 3350 17 G PO PACK
17.0000 g | PACK | Freq: Every day | ORAL | Status: DC
  Administered 2024-04-25: 17 g via ORAL
  Filled 2024-04-25: qty 1

## 2024-04-25 MED ORDER — MORPHINE SULFATE (PF) 2 MG/ML IV SOLN
2.0000 mg | INTRAVENOUS | Status: DC | PRN
  Administered 2024-04-25 (×2): 2 mg via INTRAVENOUS
  Filled 2024-04-25 (×2): qty 1
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
## 2024-04-25 NOTE — TOC Progression Note (Signed)
 Transition of Care [facility]) - Progression Note    Patient Details  Name: Tamara Hicks MRN: [id number] Date of Birth: 25-Nov-1970  Transition of Care [facility]) CM/SW Contact  Denene Alamillo E Imani Fiebelkorn, LCSW Phone Number: 04/25/2024, 1:37 PM  Clinical Narrative:    Plan is now for CIR pending insurance auth. [employer] DME referral will need to be cancelled and DME will need to be picked up if patient goes to CIR.    Expected Discharge Plan: OP Rehab Barriers to Discharge: Continued Medical Work up               Expected Discharge Plan and Services       Living arrangements for the past 2 months: Single Family Home                                       Social Drivers of Health (SDOH) Interventions SDOH Screenings   Food Insecurity: No Food Insecurity (04/22/2024)  Housing: Low Risk  (04/22/2024)  Transportation Needs: No Transportation Needs (04/22/2024)  Utilities: Not At Risk (04/22/2024)  Financial Resource Strain: Low Risk  (12/13/2021)   Received from [facility]  Physical Activity: Unknown (12/13/2021)   Received from [facility]  Social Connections: Unknown (12/16/2022)   Received from [facility]  Stress: Stress Concern Present (12/13/2021)   Received from [facility]  Tobacco Use: Medium Risk (04/22/2024)    Readmission Risk Interventions    02/20/2024   12:44 PM  Readmission Risk Prevention Plan  Post Dischage Appt Complete  Medication Screening Complete  Transportation Screening Complete

## 2024-04-25 NOTE — Plan of Care (Signed)

## 2024-04-25 NOTE — Progress Notes (Signed)
 Inpatient Rehab Coordinator Note:  I met with patient at bedside to discuss CIR recommendations and goals/expectations of CIR stay.  We reviewed 3 hrs/day of therapy, physician follow up, and average length of stay 2 weeks (dependent upon progress) with goals of supervision to mod I.  We discussed insurance auth and she is open to pursuing.  Home with mom (75) and 2 school-aged children.    Reche Lowers, PT, DPT Admissions Coordinator [phone] 04/25/24  1:27 PM

## 2024-04-25 NOTE — Telephone Encounter (Signed)
 Sure  6-8 weeks is fine.  Thanks  Lonni Phillips, MD    EUS order placed and triaged.
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
## 2024-04-26 ENCOUNTER — Encounter (HOSPITAL_COMMUNITY): Payer: Self-pay | Admitting: Physical Medicine and Rehabilitation

## 2024-04-26 ENCOUNTER — Inpatient Hospital Stay (HOSPITAL_COMMUNITY)

## 2024-04-26 ENCOUNTER — Inpatient Hospital Stay (HOSPITAL_COMMUNITY)
Admission: AD | Admit: 2024-04-26 | Discharge: 2024-05-07 | DRG: 561 | Disposition: A | Discharge status: Home / Self Care | Source: Intra-hospital | Attending: Physical Medicine and Rehabilitation | Admitting: Physical Medicine and Rehabilitation

## 2024-04-26 ENCOUNTER — Other Ambulatory Visit: Payer: Self-pay

## 2024-04-26 DIAGNOSIS — R1310 Dysphagia, unspecified: Secondary | ICD-10-CM | POA: Diagnosis present

## 2024-04-26 DIAGNOSIS — D62 Acute posthemorrhagic anemia: Secondary | ICD-10-CM | POA: Diagnosis present

## 2024-04-26 DIAGNOSIS — Z79899 Other long term (current) drug therapy: Secondary | ICD-10-CM | POA: Diagnosis not present

## 2024-04-26 DIAGNOSIS — Z87891 Personal history of nicotine dependence: Secondary | ICD-10-CM | POA: Diagnosis not present

## 2024-04-26 DIAGNOSIS — Z9889 Other specified postprocedural states: Secondary | ICD-10-CM

## 2024-04-26 DIAGNOSIS — K224 Dyskinesia of esophagus: Secondary | ICD-10-CM | POA: Diagnosis present

## 2024-04-26 DIAGNOSIS — S2249XA Multiple fractures of ribs, unspecified side, initial encounter for closed fracture: Secondary | ICD-10-CM | POA: Diagnosis present

## 2024-04-26 DIAGNOSIS — Z7409 Other reduced mobility: Secondary | ICD-10-CM | POA: Diagnosis present

## 2024-04-26 DIAGNOSIS — Z4789 Encounter for other orthopedic aftercare: Secondary | ICD-10-CM | POA: Diagnosis present

## 2024-04-26 DIAGNOSIS — F411 Generalized anxiety disorder: Secondary | ICD-10-CM | POA: Diagnosis present

## 2024-04-26 DIAGNOSIS — Z860100 Personal history of colon polyps, unspecified: Secondary | ICD-10-CM | POA: Diagnosis not present

## 2024-04-26 DIAGNOSIS — R1013 Epigastric pain: Secondary | ICD-10-CM | POA: Diagnosis not present

## 2024-04-26 DIAGNOSIS — K5900 Constipation, unspecified: Secondary | ICD-10-CM | POA: Diagnosis not present

## 2024-04-26 DIAGNOSIS — R739 Hyperglycemia, unspecified: Secondary | ICD-10-CM | POA: Diagnosis not present

## 2024-04-26 DIAGNOSIS — S2241XS Multiple fractures of ribs, right side, sequela: Secondary | ICD-10-CM

## 2024-04-26 DIAGNOSIS — E041 Nontoxic single thyroid nodule: Secondary | ICD-10-CM | POA: Diagnosis present

## 2024-04-26 DIAGNOSIS — S20219A Contusion of unspecified front wall of thorax, initial encounter: Secondary | ICD-10-CM | POA: Diagnosis not present

## 2024-04-26 DIAGNOSIS — R7401 Elevation of levels of liver transaminase levels: Secondary | ICD-10-CM | POA: Diagnosis not present

## 2024-04-26 DIAGNOSIS — T07XXXA Unspecified multiple injuries, initial encounter: Secondary | ICD-10-CM | POA: Diagnosis not present

## 2024-04-26 DIAGNOSIS — B952 Enterococcus as the cause of diseases classified elsewhere: Secondary | ICD-10-CM | POA: Diagnosis present

## 2024-04-26 DIAGNOSIS — K219 Gastro-esophageal reflux disease without esophagitis: Secondary | ICD-10-CM | POA: Diagnosis present

## 2024-04-26 DIAGNOSIS — R09A2 Foreign body sensation, throat: Secondary | ICD-10-CM | POA: Diagnosis not present

## 2024-04-26 DIAGNOSIS — Z8249 Family history of ischemic heart disease and other diseases of the circulatory system: Secondary | ICD-10-CM | POA: Diagnosis not present

## 2024-04-26 DIAGNOSIS — R300 Dysuria: Secondary | ICD-10-CM | POA: Diagnosis not present

## 2024-04-26 DIAGNOSIS — S52502S Unspecified fracture of the lower end of left radius, sequela: Secondary | ICD-10-CM

## 2024-04-26 DIAGNOSIS — E039 Hypothyroidism, unspecified: Secondary | ICD-10-CM | POA: Diagnosis present

## 2024-04-26 DIAGNOSIS — Z79890 Hormone replacement therapy (postmenopausal): Secondary | ICD-10-CM | POA: Diagnosis not present

## 2024-04-26 DIAGNOSIS — R911 Solitary pulmonary nodule: Secondary | ICD-10-CM | POA: Diagnosis present

## 2024-04-26 DIAGNOSIS — Z8781 Personal history of (healed) traumatic fracture: Secondary | ICD-10-CM

## 2024-04-26 DIAGNOSIS — E669 Obesity, unspecified: Secondary | ICD-10-CM | POA: Diagnosis present

## 2024-04-26 DIAGNOSIS — S42002S Fracture of unspecified part of left clavicle, sequela: Secondary | ICD-10-CM | POA: Diagnosis not present

## 2024-04-26 DIAGNOSIS — Z6835 Body mass index (BMI) 35.0-35.9, adult: Secondary | ICD-10-CM

## 2024-04-26 MED ORDER — LEVOTHYROXINE SODIUM 50 MCG PO TABS
50.0000 ug | ORAL_TABLET | Freq: Every day | ORAL | Status: DC
  Administered 2024-04-27 – 2024-05-07 (×14): 50 ug via ORAL
  Filled 2024-04-26 (×11): qty 1

## 2024-04-26 MED ORDER — GUAIFENESIN-DM 100-10 MG/5ML PO SYRP: 5.0000 mL | ORAL_SOLUTION | Freq: Four times a day (QID) | ORAL | Status: DC | PRN

## 2024-04-26 MED ORDER — POLYETHYLENE GLYCOL 3350 17 G PO PACK
17.0000 g | PACK | Freq: Two times a day (BID) | ORAL | Status: DC
  Administered 2024-04-26 – 2024-05-06 (×16): 17 g via ORAL
  Filled 2024-04-26 (×23): qty 1

## 2024-04-26 MED ORDER — OXYCODONE HCL 5 MG PO TABS
10.0000 mg | ORAL_TABLET | ORAL | Status: DC | PRN
  Administered 2024-04-26: 15 mg via ORAL
  Administered 2024-04-26 – 2024-04-27 (×3): 10 mg via ORAL
  Administered 2024-04-29 – 2024-04-30 (×3): 15 mg via ORAL
  Administered 2024-04-30: 10 mg via ORAL
  Administered 2024-04-30: 15 mg via ORAL
  Administered 2024-04-30: 10 mg via ORAL
  Administered 2024-04-30: 15 mg via ORAL
  Administered 2024-04-30 (×2): 10 mg via ORAL
  Administered 2024-04-30: 15 mg via ORAL
  Administered 2024-05-01 (×4): 10 mg via ORAL
  Administered 2024-05-02 – 2024-05-03 (×3): 15 mg via ORAL
  Administered 2024-05-03 – 2024-05-05 (×8): 10 mg via ORAL
  Administered 2024-05-06 (×2): 15 mg via ORAL
  Administered 2024-05-07: 10 mg via ORAL
  Filled 2024-04-26: qty 3
  Filled 2024-04-26 (×2): qty 2
  Filled 2024-04-26: qty 3
  Filled 2024-04-26: qty 2
  Filled 2024-04-26: qty 3
  Filled 2024-04-26: qty 2
  Filled 2024-04-26: qty 3
  Filled 2024-04-26 (×2): qty 2
  Filled 2024-04-26: qty 3
  Filled 2024-04-26: qty 2
  Filled 2024-04-26: qty 3
  Filled 2024-04-26: qty 2
  Filled 2024-04-26 (×3): qty 3
  Filled 2024-04-26 (×3): qty 2
  Filled 2024-04-26 (×2): qty 3
  Filled 2024-04-26 (×3): qty 2
  Filled 2024-04-26: qty 3

## 2024-04-26 MED ORDER — ALUM & MAG HYDROXIDE-SIMETH 200-200-20 MG/5ML PO SUSP
30.0000 mL | ORAL | Status: DC | PRN
  Administered 2024-04-30 (×2): 30 mL via ORAL
  Filled 2024-04-26: qty 30

## 2024-04-26 MED ORDER — DOCUSATE SODIUM 100 MG PO CAPS
100.0000 mg | ORAL_CAPSULE | Freq: Two times a day (BID) | ORAL | Status: DC
  Administered 2024-04-26 – 2024-05-07 (×28): 100 mg via ORAL
  Filled 2024-04-26 (×23): qty 1

## 2024-04-26 MED ORDER — PROCHLORPERAZINE MALEATE 5 MG PO TABS
5.0000 mg | ORAL_TABLET | Freq: Four times a day (QID) | ORAL | Status: DC | PRN
  Administered 2024-05-04: 5 mg via ORAL
  Filled 2024-04-26: qty 2

## 2024-04-26 MED ORDER — DIPHENHYDRAMINE HCL 25 MG PO CAPS
25.0000 mg | ORAL_CAPSULE | Freq: Four times a day (QID) | ORAL | Status: DC | PRN
Start: 2024-04-26 — End: 2024-05-07

## 2024-04-26 MED ORDER — CYCLOBENZAPRINE HCL 5 MG PO TABS
10.0000 mg | ORAL_TABLET | Freq: Three times a day (TID) | ORAL | Status: DC
  Administered 2024-04-26 – 2024-05-07 (×42): 10 mg via ORAL
  Filled 2024-04-26 (×35): qty 2

## 2024-04-26 MED ORDER — ZINC SULFATE 220 (50 ZN) MG PO CAPS
220.0000 mg | ORAL_CAPSULE | Freq: Every day | ORAL | Status: DC
  Administered 2024-04-26 – 2024-05-01 (×9): 220 mg via ORAL
  Filled 2024-04-26 (×6): qty 1

## 2024-04-26 MED ORDER — BISACODYL 10 MG RE SUPP: 10.0000 mg | Freq: Every day | RECTAL | Status: DC | PRN

## 2024-04-26 MED ORDER — DICLOFENAC SODIUM 1 % EX GEL
4.0000 g | Freq: Three times a day (TID) | CUTANEOUS | Status: DC
  Administered 2024-04-26 – 2024-05-06 (×39): 4 g via TOPICAL
  Filled 2024-04-26: qty 100

## 2024-04-26 MED ORDER — LIDOCAINE 5 % EX PTCH
2.0000 | MEDICATED_PATCH | CUTANEOUS | Status: DC
  Administered 2024-04-27 – 2024-05-07 (×12): 2 via TRANSDERMAL
  Filled 2024-04-26 (×11): qty 2

## 2024-04-26 MED ORDER — LORAZEPAM 0.5 MG PO TABS
0.5000 mg | ORAL_TABLET | Freq: Every day | ORAL | Status: DC | PRN
  Administered 2024-05-06: 0.5 mg via ORAL
  Filled 2024-04-26: qty 1

## 2024-04-26 MED ORDER — PROCHLORPERAZINE EDISYLATE 10 MG/2ML IJ SOLN
5.0000 mg | Freq: Four times a day (QID) | INTRAMUSCULAR | Status: DC | PRN
  Administered 2024-04-27 – 2024-04-30 (×4): 5 mg via INTRAVENOUS
  Administered 2024-05-01 (×2): 10 mg via INTRAVENOUS
  Filled 2024-04-26 (×4): qty 2

## 2024-04-26 MED ORDER — TRAZODONE HCL 50 MG PO TABS
25.0000 mg | ORAL_TABLET | Freq: Every evening | ORAL | Status: DC | PRN
  Administered 2024-04-27 – 2024-05-01 (×7): 50 mg via ORAL
  Administered 2024-05-06: 25 mg via ORAL
  Filled 2024-04-26 (×5): qty 1

## 2024-04-26 MED ORDER — PROCHLORPERAZINE 25 MG RE SUPP
12.5000 mg | Freq: Four times a day (QID) | RECTAL | Status: DC | PRN
Start: 2024-04-26 — End: 2024-05-07

## 2024-04-26 MED ORDER — ENOXAPARIN SODIUM 30 MG/0.3ML IJ SOSY
30.0000 mg | PREFILLED_SYRINGE | Freq: Two times a day (BID) | INTRAMUSCULAR | Status: DC
  Administered 2024-04-26: 30 mg via SUBCUTANEOUS
  Filled 2024-04-26: qty 0.3

## 2024-04-26 MED ORDER — LOSARTAN POTASSIUM 25 MG PO TABS
25.0000 mg | ORAL_TABLET | Freq: Every day | ORAL | Status: DC
  Administered 2024-04-27 – 2024-05-07 (×14): 25 mg via ORAL
  Filled 2024-04-26 (×11): qty 1

## 2024-04-26 MED ORDER — FLEET ENEMA RE ENEM: 1.0000 | ENEMA | Freq: Once | RECTAL | Status: DC | PRN

## 2024-04-26 MED ORDER — PANTOPRAZOLE SODIUM 40 MG PO TBEC
40.0000 mg | DELAYED_RELEASE_TABLET | Freq: Every day | ORAL | Status: DC
  Administered 2024-04-27 – 2024-04-28 (×2): 40 mg via ORAL
  Filled 2024-04-26 (×2): qty 1

## 2024-04-26 MED ORDER — ACETAMINOPHEN 325 MG PO TABS: 325.0000 mg | ORAL_TABLET | ORAL | Status: DC | PRN

## 2024-04-26 MED ORDER — POLYETHYLENE GLYCOL 3350 17 G PO PACK
17.0000 g | PACK | Freq: Two times a day (BID) | ORAL | Status: DC
  Administered 2024-04-26: 17 g via ORAL
  Filled 2024-04-26: qty 1

## 2024-04-26 MED ORDER — MESALAMINE 1.2 G PO TBEC: 1.2000 g | DELAYED_RELEASE_TABLET | Freq: Every day | ORAL | Status: DC | PRN

## 2024-04-26 MED ORDER — TORSEMIDE 20 MG PO TABS: 10.0000 mg | ORAL_TABLET | Freq: Every day | ORAL | Status: DC | PRN

## 2024-04-26 MED ORDER — ACETAMINOPHEN 500 MG PO TABS
1000.0000 mg | ORAL_TABLET | Freq: Four times a day (QID) | ORAL | Status: DC
  Administered 2024-04-26 – 2024-04-27 (×4): 1000 mg via ORAL
  Filled 2024-04-26 (×4): qty 2

## 2024-04-26 MED ORDER — MORPHINE SULFATE (PF) 2 MG/ML IV SOLN: 1.0000 mg | Freq: Four times a day (QID) | INTRAVENOUS | Status: DC | PRN

## 2024-04-26 MED ORDER — LIDOCAINE 5 % EX PTCH: 1.0000 | MEDICATED_PATCH | CUTANEOUS | Status: DC

## 2024-04-26 NOTE — Progress Notes (Signed)
 Patient ID: Tamara Hicks, female   DOB: 10-18-1970, 53 y.o.   MRN: [id number] Met with the patient to review current medical situation, rehab process, team conference and plan of care. Discussed medications, pain control and weight bearing restrictions.  Discussed dietary modifications for colitis. FMLA paperwork to be filled out; patient given info for emailing.  Continue to follow along to address educational needs to facilitate preparation for discharge. Fredericka Barnie NOVAK

## 2024-04-26 NOTE — Progress Notes (Signed)
 Gave report to [place] for patient transfer to 4 [facility] room 03. Per Aleck, we can leave PIV in place.
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
## 2024-04-26 NOTE — TOC Transition Note (Signed)
 Transition of Care [facility]) - Discharge Note   Patient Details  Name: Tamara Hicks MRN: [id number] Date of Birth: June 10, 1971  Transition of Care [facility]) CM/SW Contact:  Marlaya Turck E Romelle Reiley, LCSW Phone Number: 04/26/2024, 10:20 AM   Clinical Narrative:    Patient is discharging to [facility].  CSW called Zack with [employer] to pick up DME from bedside- DME will need to be ordered again post rehab based on recs at that time.   Final next level of care: IP Rehab Facility Barriers to Discharge: Barriers Resolved   Patient Goals and CMS Choice Patient states their goals for this hospitalization and ongoing recovery are:: home with OP rehab CMS Medicare.gov Compare Post Acute Care list provided to:: Patient Choice offered to / list presented to : Patient      Discharge Placement                    Patient and family notified of of transfer: 04/26/24  Discharge Plan and Services Additional resources added to the After Visit Summary for                                       Social Drivers of Health (SDOH) Interventions SDOH Screenings   Food Insecurity: No Food Insecurity (04/22/2024)  Housing: Low Risk  (04/22/2024)  Transportation Needs: No Transportation Needs (04/22/2024)  Utilities: Not At Risk (04/22/2024)  Financial Resource Strain: Low Risk  (12/13/2021)   Received from [facility]  Physical Activity: Unknown (12/13/2021)   Received from [facility]  Social Connections: Unknown (12/16/2022)   Received from [facility]  Stress: Stress Concern Present (12/13/2021)   Received from [facility]  Tobacco Use: Medium Risk (04/22/2024)     Readmission Risk Interventions    02/20/2024   12:44 PM  Readmission Risk Prevention Plan  Post Dischage Appt Complete  Medication Screening Complete  Transportation Screening Complete
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
## 2024-04-26 NOTE — Progress Notes (Signed)
 While assisting patient back to bed, witnessed a popping sound located near the shoulder. MD notified. Stat CT ordered.

## 2024-04-26 NOTE — Plan of Care (Signed)
  Problem: Consults Goal: RH GENERAL PATIENT EDUCATION Description: See Patient Education module for education specifics. Outcome: Progressing   Problem: RH BOWEL ELIMINATION Goal: RH STG MANAGE BOWEL WITH ASSISTANCE Description: STG Manage Bowel with mod I Assistance. Outcome: Progressing Goal: RH STG MANAGE BOWEL W/MEDICATION W/ASSISTANCE Description: STG Manage Bowel with Medication with mod I  Assistance. Outcome: Progressing   Problem: RH SAFETY Goal: RH STG ADHERE TO SAFETY PRECAUTIONS W/ASSISTANCE/DEVICE Description: STG Adhere to Safety Precautions With cues Assistance/Device. Outcome: Progressing   Problem: RH PAIN MANAGEMENT Goal: RH STG PAIN MANAGED AT OR BELOW PT'S PAIN GOAL Description: < 4 with prns Outcome: Progressing   Problem: RH KNOWLEDGE DEFICIT GENERAL Goal: RH STG INCREASE KNOWLEDGE OF SELF CARE AFTER HOSPITALIZATION Description: Patient will be able to manage care at discharge using educational resources independently Outcome: Progressing

## 2024-04-26 NOTE — Progress Notes (Signed)
 Inpatient Rehabilitation Admission Medication Review by a Pharmacist  A complete drug regimen review was completed for this patient to identify any potential clinically significant medication issues.  High Risk Drug Classes Is patient taking? Indication by Medication  Antipsychotic Yes Compazine  prn N/V  Anticoagulant Yes Lovenox  - VTE ppx  Antibiotic No   Opioid Yes Oxycodone  prn pain  Antiplatelet No   Hypoglycemics/insulin No   Vasoactive Medication Yes Losartan  - HTN Torsemide  - fluid  Chemotherapy No   Other Yes Trazodone  prn sleep Pantoprazole  - reflux Cyclobenzaprine  - muscle spasms  Diclofenac  gel, lidocaine  patch - pain  Levothyroxine  - low thyroid Lorazepam  prn anxiety Mesalamine  - IBS     Type of Medication Issue Identified Description of Issue Recommendation(s)  Drug Interaction(s) (clinically significant)     Duplicate Therapy     Allergy      No Medication Administration End Date     Incorrect Dose     Additional Drug Therapy Needed     Significant med changes from prior encounter (inform family/care partners about these prior to discharge).    Other       Clinically significant medication issues were identified that warrant physician communication and completion of prescribed/recommended actions by midnight of the next day:  No  Name of provider notified for urgent issues identified:   Provider Method of Notification:     Pharmacist comments: None  Time spent performing this drug regimen review (minutes):  20 minutes  Thank you. Olam Monte, PharmD
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
## 2024-04-26 NOTE — Plan of Care (Signed)

## 2024-04-26 NOTE — Progress Notes (Signed)
 Inpatient Rehab Admissions Coordinator:    I have insurance approval and a bed available for pt to admit to CIR today. Burnard Louder, PA-C, in agreement and [facility] aware.  I will notify pt/family and make arrangements for admit to CIR today.   Reche Lowers, PT, DPT Admissions Coordinator [phone] 04/26/24  10:33 AM

## 2024-04-26 NOTE — Plan of Care (Signed)
  Problem: Education: Goal: Knowledge of General Education information will improve Description: Including pain rating scale, medication(s)/side effects and non-pharmacologic comfort measures 04/26/2024 0706 by Lang Hickory H, LPN Outcome: Progressing 04/26/2024 0706 by Lang Hickory DEL, LPN Outcome: Progressing   Problem: Health Behavior/Discharge Planning: Goal: Ability to manage health-related needs will improve 04/26/2024 0706 by Lang Hickory DEL, LPN Outcome: Progressing 04/26/2024 0706 by Lang Hickory DEL, LPN Outcome: Progressing   Problem: Clinical Measurements: Goal: Ability to maintain clinical measurements within normal limits will improve 04/26/2024 0706 by Lang Hickory DEL, LPN Outcome: Progressing 04/26/2024 0706 by Lang Hickory H, LPN Outcome: Progressing Goal: Will remain free from infection Outcome: Progressing Goal: Diagnostic test results will improve Outcome: Progressing Goal: Respiratory complications will improve Outcome: Progressing   Problem: Activity: Goal: Risk for activity intolerance will decrease Outcome: Progressing   Problem: Nutrition: Goal: Adequate nutrition will be maintained Outcome: Progressing   Problem: Coping: Goal: Level of anxiety will decrease Outcome: Progressing
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
## 2024-04-27 DIAGNOSIS — K5900 Constipation, unspecified: Secondary | ICD-10-CM

## 2024-04-27 DIAGNOSIS — D62 Acute posthemorrhagic anemia: Secondary | ICD-10-CM

## 2024-04-27 DIAGNOSIS — T07XXXA Unspecified multiple injuries, initial encounter: Secondary | ICD-10-CM | POA: Diagnosis not present

## 2024-04-27 DIAGNOSIS — R7401 Elevation of levels of liver transaminase levels: Secondary | ICD-10-CM | POA: Diagnosis not present

## 2024-04-27 LAB — CBC WITH DIFFERENTIAL/PLATELET
Abs Immature Granulocytes: 0.04 K/uL (ref 0.00–0.07)
Basophils Absolute: 0 K/uL (ref 0.0–0.1)
Basophils Relative: 1 %
Eosinophils Absolute: 0.2 K/uL (ref 0.0–0.5)
Eosinophils Relative: 5 %
HCT: 35.3 % — ABNORMAL LOW (ref 36.0–46.0)
Hemoglobin: 11.8 g/dL — ABNORMAL LOW (ref 12.0–15.0)
Immature Granulocytes: 1 %
Lymphocytes Relative: 30 %
Lymphs Abs: 1.4 K/uL (ref 0.7–4.0)
MCH: 30.6 pg (ref 26.0–34.0)
MCHC: 33.4 g/dL (ref 30.0–36.0)
MCV: 91.7 fL (ref 80.0–100.0)
Monocytes Absolute: 0.4 K/uL (ref 0.1–1.0)
Monocytes Relative: 8 %
Neutro Abs: 2.7 K/uL (ref 1.7–7.7)
Neutrophils Relative %: 55 %
Platelets: 255 K/uL (ref 150–400)
RBC: 3.85 MIL/uL — ABNORMAL LOW (ref 3.87–5.11)
RDW: 12.4 % (ref 11.5–15.5)
WBC: 4.8 K/uL (ref 4.0–10.5)
nRBC: 0 % (ref 0.0–0.2)

## 2024-04-27 LAB — COMPREHENSIVE METABOLIC PANEL WITH GFR
ALT: 59 U/L — ABNORMAL HIGH (ref 0–44)
AST: 74 U/L — ABNORMAL HIGH (ref 15–41)
Albumin: 2.9 g/dL — ABNORMAL LOW (ref 3.5–5.0)
Alkaline Phosphatase: 80 U/L (ref 38–126)
Anion gap: 8 (ref 5–15)
BUN: 12 mg/dL (ref 6–20)
CO2: 25 mmol/L (ref 22–32)
Calcium: 9.4 mg/dL (ref 8.9–10.3)
Chloride: 108 mmol/L (ref 98–111)
Creatinine, Ser: 0.79 mg/dL (ref 0.44–1.00)
GFR, Estimated: >60 mL/min (ref >60)
Glucose, Bld: 107 mg/dL — ABNORMAL HIGH (ref 70–99)
Potassium: 4.4 mmol/L (ref 3.5–5.1)
Sodium: 141 mmol/L (ref 135–145)
Total Bilirubin: 0.8 mg/dL (ref 0.0–1.2)
Total Protein: 5.6 g/dL — ABNORMAL LOW (ref 6.5–8.1)

## 2024-04-27 MED ORDER — ACETAMINOPHEN 500 MG PO TABS
500.0000 mg | ORAL_TABLET | Freq: Four times a day (QID) | ORAL | Status: DC
  Administered 2024-04-27 – 2024-04-29 (×7): 500 mg via ORAL
  Filled 2024-04-27 (×7): qty 1

## 2024-04-27 MED ORDER — ENOXAPARIN SODIUM 40 MG/0.4ML IJ SOSY
40.0000 mg | PREFILLED_SYRINGE | Freq: Every day | INTRAMUSCULAR | Status: DC
  Administered 2024-04-27 – 2024-05-07 (×14): 40 mg via SUBCUTANEOUS
  Filled 2024-04-27 (×12): qty 0.4

## 2024-04-27 MED ORDER — MAGNESIUM HYDROXIDE 400 MG/5ML PO SUSP
60.0000 mL | Freq: Once | ORAL | Status: AC
  Administered 2024-04-27: 60 mL via ORAL
  Filled 2024-04-27: qty 60
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
## 2024-04-27 NOTE — Plan of Care (Signed)
  Problem: RH Balance Goal: LTG Patient will maintain dynamic standing with ADLs (OT) Description: LTG:  Patient will maintain dynamic standing balance with assist during activities of daily living (OT)  Flowsheets (Taken 04/27/2024 1455) LTG: Pt will maintain dynamic standing balance during ADLs with: Independent with assistive device   Problem: Sit to Stand Goal: LTG:  Patient will perform sit to stand in prep for activites of daily living with assistance level (OT) Description: LTG:  Patient will perform sit to stand in prep for activites of daily living with assistance level (OT) Flowsheets (Taken 04/27/2024 1455) LTG: PT will perform sit to stand in prep for activites of daily living with assistance level: Independent with assistive device   Problem: RH Grooming Goal: LTG Patient will perform grooming w/assist,cues/equip (OT) Description: LTG: Patient will perform grooming with assist, with/without cues using equipment (OT) Flowsheets (Taken 04/27/2024 1455) LTG: Pt will perform grooming with assistance level of: Independent with assistive device    Problem: RH Bathing Goal: LTG Patient will bathe all body parts with assist levels (OT) Description: LTG: Patient will bathe all body parts with assist levels (OT) Flowsheets (Taken 04/27/2024 1455) LTG: Pt will perform bathing with assistance level/cueing: Supervision/Verbal cueing   Problem: RH Dressing Goal: LTG Patient will perform upper body dressing (OT) Description: LTG Patient will perform upper body dressing with assist, with/without cues (OT). Flowsheets (Taken 04/27/2024 1455) LTG: Pt will perform upper body dressing with assistance level of: Independent with assistive device Goal: LTG Patient will perform lower body dressing w/assist (OT) Description: LTG: Patient will perform lower body dressing with assist, with/without cues in positioning using equipment (OT) Flowsheets (Taken 04/27/2024 1455) LTG: Pt will perform lower body  dressing with assistance level of: Supervision/Verbal cueing   Problem: RH Toileting Goal: LTG Patient will perform toileting task (3/3 steps) with assistance level (OT) Description: LTG: Patient will perform toileting task (3/3 steps) with assistance level (OT)  Flowsheets (Taken 04/27/2024 1455) LTG: Pt will perform toileting task (3/3 steps) with assistance level: Supervision/Verbal cueing   Problem: RH Toilet Transfers Goal: LTG Patient will perform toilet transfers w/assist (OT) Description: LTG: Patient will perform toilet transfers with assist, with/without cues using equipment (OT) Flowsheets (Taken 04/27/2024 1455) LTG: Pt will perform toilet transfers with assistance level of: Supervision/Verbal cueing   Problem: RH Tub/Shower Transfers Goal: LTG Patient will perform tub/shower transfers w/assist (OT) Description: LTG: Patient will perform tub/shower transfers with assist, with/without cues using equipment (OT) Flowsheets (Taken 04/27/2024 1455) LTG: Pt will perform tub/shower stall transfers with assistance level of: Supervision/Verbal cueing

## 2024-04-28 ENCOUNTER — Inpatient Hospital Stay (HOSPITAL_COMMUNITY)

## 2024-04-28 DIAGNOSIS — T07XXXA Unspecified multiple injuries, initial encounter: Secondary | ICD-10-CM | POA: Diagnosis not present

## 2024-04-28 DIAGNOSIS — R1310 Dysphagia, unspecified: Secondary | ICD-10-CM | POA: Diagnosis not present

## 2024-04-28 DIAGNOSIS — K5900 Constipation, unspecified: Secondary | ICD-10-CM | POA: Diagnosis not present

## 2024-04-28 DIAGNOSIS — R7401 Elevation of levels of liver transaminase levels: Secondary | ICD-10-CM | POA: Diagnosis not present

## 2024-04-28 MED ORDER — FLEET ENEMA RE ENEM
1.0000 | ENEMA | Freq: Once | RECTAL | Status: AC
  Administered 2024-04-28: 1 via RECTAL
  Filled 2024-04-28: qty 1

## 2024-04-28 MED ORDER — PANTOPRAZOLE SODIUM 40 MG PO TBEC
40.0000 mg | DELAYED_RELEASE_TABLET | Freq: Two times a day (BID) | ORAL | Status: DC
  Administered 2024-04-28 – 2024-05-03 (×16): 40 mg via ORAL
  Filled 2024-04-28 (×10): qty 1

## 2024-04-28 MED ORDER — SORBITOL 70 % SOLN
30.0000 mL | Freq: Every day | Status: DC | PRN
  Filled 2024-04-28: qty 30
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
## 2024-04-28 NOTE — Plan of Care (Signed)
  Problem: Consults Goal: RH GENERAL PATIENT EDUCATION Description: See Patient Education module for education specifics. Outcome: Progressing   Problem: RH BOWEL ELIMINATION Goal: RH STG MANAGE BOWEL WITH ASSISTANCE Description: STG Manage Bowel with mod I Assistance. Outcome: Progressing Goal: RH STG MANAGE BOWEL W/MEDICATION W/ASSISTANCE Description: STG Manage Bowel with Medication with mod I  Assistance. Outcome: Progressing   Problem: RH SAFETY Goal: RH STG ADHERE TO SAFETY PRECAUTIONS W/ASSISTANCE/DEVICE Description: STG Adhere to Safety Precautions With cues Assistance/Device. Outcome: Progressing   Problem: RH PAIN MANAGEMENT Goal: RH STG PAIN MANAGED AT OR BELOW PT'S PAIN GOAL Description: < 4 with prns Outcome: Progressing   Problem: RH KNOWLEDGE DEFICIT GENERAL Goal: RH STG INCREASE KNOWLEDGE OF SELF CARE AFTER HOSPITALIZATION Description: Patient will be able to manage care at discharge using educational resources independently Outcome: Progressing

## 2024-04-29 DIAGNOSIS — T07XXXA Unspecified multiple injuries, initial encounter: Secondary | ICD-10-CM | POA: Diagnosis not present

## 2024-04-29 LAB — CBC WITH DIFFERENTIAL/PLATELET
Abs Immature Granulocytes: 0.04 K/uL (ref 0.00–0.07)
Basophils Absolute: 0 K/uL (ref 0.0–0.1)
Basophils Relative: 1 %
Eosinophils Absolute: 0.2 K/uL (ref 0.0–0.5)
Eosinophils Relative: 3 %
HCT: 37.2 % (ref 36.0–46.0)
Hemoglobin: 12.4 g/dL (ref 12.0–15.0)
Immature Granulocytes: 1 %
Lymphocytes Relative: 17 %
Lymphs Abs: 1 K/uL (ref 0.7–4.0)
MCH: 30.6 pg (ref 26.0–34.0)
MCHC: 33.3 g/dL (ref 30.0–36.0)
MCV: 91.9 fL (ref 80.0–100.0)
Monocytes Absolute: 0.4 K/uL (ref 0.1–1.0)
Monocytes Relative: 7 %
Neutro Abs: 4.2 K/uL (ref 1.7–7.7)
Neutrophils Relative %: 71 %
Platelets: 286 K/uL (ref 150–400)
RBC: 4.05 MIL/uL (ref 3.87–5.11)
RDW: 12.7 % (ref 11.5–15.5)
WBC: 5.9 K/uL (ref 4.0–10.5)
nRBC: 0 % (ref 0.0–0.2)

## 2024-04-29 LAB — HEPATIC FUNCTION PANEL
ALT: 124 U/L — ABNORMAL HIGH (ref 0–44)
AST: 112 U/L — ABNORMAL HIGH (ref 15–41)
Albumin: 3.1 g/dL — ABNORMAL LOW (ref 3.5–5.0)
Alkaline Phosphatase: 90 U/L (ref 38–126)
Bilirubin, Direct: 0.2 mg/dL (ref 0.0–0.2)
Indirect Bilirubin: 0.8 mg/dL (ref 0.3–0.9)
Total Bilirubin: 1 mg/dL (ref 0.0–1.2)
Total Protein: 6 g/dL — ABNORMAL LOW (ref 6.5–8.1)

## 2024-04-29 LAB — BASIC METABOLIC PANEL WITH GFR
Anion gap: 7 (ref 5–15)
BUN: 12 mg/dL (ref 6–20)
CO2: 27 mmol/L (ref 22–32)
Calcium: 9.6 mg/dL (ref 8.9–10.3)
Chloride: 106 mmol/L (ref 98–111)
Creatinine, Ser: 0.78 mg/dL (ref 0.44–1.00)
GFR, Estimated: >60 mL/min (ref >60)
Glucose, Bld: 109 mg/dL — ABNORMAL HIGH (ref 70–99)
Potassium: 4.6 mmol/L (ref 3.5–5.1)
Sodium: 140 mmol/L (ref 135–145)
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
## 2024-04-29 NOTE — Plan of Care (Signed)
  Problem: Consults Goal: RH GENERAL PATIENT EDUCATION Description: See Patient Education module for education specifics. Outcome: Progressing   Problem: RH BOWEL ELIMINATION Goal: RH STG MANAGE BOWEL WITH ASSISTANCE Description: STG Manage Bowel with mod I Assistance. Outcome: Progressing Goal: RH STG MANAGE BOWEL W/MEDICATION W/ASSISTANCE Description: STG Manage Bowel with Medication with mod I  Assistance. Outcome: Progressing   Problem: RH SAFETY Goal: RH STG ADHERE TO SAFETY PRECAUTIONS W/ASSISTANCE/DEVICE Description: STG Adhere to Safety Precautions With cues Assistance/Device. Outcome: Progressing   Problem: RH PAIN MANAGEMENT Goal: RH STG PAIN MANAGED AT OR BELOW PT'S PAIN GOAL Description: < 4 with prns Outcome: Progressing   Problem: RH KNOWLEDGE DEFICIT GENERAL Goal: RH STG INCREASE KNOWLEDGE OF SELF CARE AFTER HOSPITALIZATION Description: Patient will be able to manage care at discharge using educational resources independently Outcome: Progressing

## 2024-04-29 NOTE — Progress Notes (Signed)
 Inpatient Rehabilitation Center Individual Statement of Services  Patient Name:  Tamara Hicks  Date:  04/29/2024  Welcome to the Inpatient Rehabilitation Center.  Our goal is to provide you with an individualized program based on your diagnosis and situation, designed to meet your specific needs.  With this comprehensive rehabilitation program, you will be expected to participate in at least 3 hours of rehabilitation therapies Monday-Friday, with modified therapy programming on the weekends.  Your rehabilitation program will include the following services:  Physical Therapy (PT), Occupational Therapy (OT), Speech Therapy (ST), 24 hour per day rehabilitation nursing, Therapeutic Recreaction (TR), Psychology, Neuropsychology, Care Coordinator, Rehabilitation Medicine, Nutrition Services, and Pharmacy Services  Weekly team conferences will be held on Wednesday to discuss your progress.  Your Inpatient Rehabilitation Care Coordinator will talk with you frequently to get your input and to update you on team discussions.  Team conferences with you and your family in attendance may also be held.  Expected length of stay: 7-10 days Overall anticipated outcome: Independent with assistive device    Depending on your progress and recovery, your program may change. Your Inpatient Rehabilitation Care Coordinator will coordinate services and will keep you informed of any changes. Your Inpatient Rehabilitation Care Coordinator's name and contact numbers are listed  below.  The following services may also be recommended but are not provided by the Inpatient Rehabilitation Center:  Driving Evaluations Home Health Rehabiltiation Services Outpatient Rehabilitation Services Vocational Rehabilitation   Arrangements will be made to provide these services after discharge if needed.  Arrangements include referral to agencies that provide these services.  Your insurance has been verified to be:  [place] Medicaid  Healthy Blue  Your primary doctor is:  Leita Monks  Pertinent information will be shared with your doctor and your insurance company.  Inpatient Rehabilitation Care Coordinator:  Graeme Feliciana SILK [phone] or [phone]  Information discussed with and copy given to patient by: Waverly Gentry, 04/29/2024, 3:32 PM

## 2024-04-29 NOTE — Progress Notes (Signed)
 Inpatient Rehabilitation  Patient information reviewed and entered into eRehab system by Burnard Mealing, OTR/L, Rehab Quality Coordinator.   Information including medical coding, functional ability and quality indicators will be reviewed and updated through discharge.
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
## 2024-04-29 NOTE — Progress Notes (Signed)
 Occupational Therapy Session Note  Patient Details  Name: Tamara Hicks MRN: [id number] Date of Birth: 27-Mar-1971  Today's Date: 04/29/2024 OT Individual Time: 9094-8984 OT Individual Time Calculation (min): 70 min    Short Term Goals: Week 1:  OT Short Term Goal 1 (Week 1): LTG=STG 2/2 ELOS  Skilled Therapeutic Interventions/Progress Updates:  Pt greeted sitting in [facility] for skilled OT session with focus on BADL retraining.   Pain: Pt reported 4/10 rib/shoulder pain, pre-mediated. OT offering intermediate rest breaks and positioning suggestions throughout session to address pain/fatigue and maximize participation/safety in session.   Functional Transfers: Sit<>stands with close supervision, ambulatory bathroom transfer in similar fashion with use of SPC.   Self Care Tasks: Pt completes the following self care tasks with levels of assistance noted below, UB: Bathing with assistance for RUE, LUE thoroughness, and hair (for pain management). Pt doff/dons dress with setup/supervision. LB: Socks/footwear with Mod I. Underwear donning with Min A for L-side hike.  Time dedicated to brushing matted hair for emotional wellbeing.   Pt remained sitting in WC with 4Ps assessed and immediate needs met. Pt continues to be appropriate for skilled OT intervention to promote further functional independence in ADLs/IADLs.   Therapy Documentation Precautions:  Precautions Precautions: Fall, Other (comment) Precaution/Restrictions Comments: L Shoulder Sling; OK  for gentle PROM/AROM of shoulder, digits, elbow Required Braces or Orthoses: Sling Restrictions Weight Bearing Restrictions Per Provider Order: Yes LUE Weight Bearing Per Provider Order: Weight bear through elbow only Other Position/Activity Restrictions: Assumed NWB until clarified by Dr   [name]: Individual Therapy  Nereida Habermann, OTR/L, MSOT  04/29/2024, 7:26 AM
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
## 2024-04-30 DIAGNOSIS — T07XXXA Unspecified multiple injuries, initial encounter: Secondary | ICD-10-CM | POA: Diagnosis not present

## 2024-04-30 DIAGNOSIS — R1013 Epigastric pain: Secondary | ICD-10-CM

## 2024-04-30 DIAGNOSIS — S20219A Contusion of unspecified front wall of thorax, initial encounter: Secondary | ICD-10-CM

## 2024-04-30 LAB — COMPREHENSIVE METABOLIC PANEL WITH GFR
ALT: 104 U/L — ABNORMAL HIGH (ref 0–44)
AST: 67 U/L — ABNORMAL HIGH (ref 15–41)
Albumin: 3.7 g/dL (ref 3.5–5.0)
Alkaline Phosphatase: 100 U/L (ref 38–126)
Anion gap: 14 (ref 5–15)
BUN: 15 mg/dL (ref 6–20)
CO2: 21 mmol/L — ABNORMAL LOW (ref 22–32)
Calcium: 10.1 mg/dL (ref 8.9–10.3)
Chloride: 103 mmol/L (ref 98–111)
Creatinine, Ser: 0.77 mg/dL (ref 0.44–1.00)
GFR, Estimated: >60 mL/min (ref >60)
Glucose, Bld: 108 mg/dL — ABNORMAL HIGH (ref 70–99)
Potassium: 4.5 mmol/L (ref 3.5–5.1)
Sodium: 138 mmol/L (ref 135–145)
Total Bilirubin: 1.1 mg/dL (ref 0.0–1.2)
Total Protein: 6.7 g/dL (ref 6.5–8.1)
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
## 2024-04-30 NOTE — Progress Notes (Signed)
 Occupational Therapy Session Note  Patient Details  Name: Tamara Hicks MRN: [id number] Date of Birth: [birth year]  Today's Date: 04/30/2024 OT Individual Time: 9152-9054 OT Individual Time Calculation (min): 58 min    Short Term Goals: Week 1:  OT Short Term Goal 1 (Week 1): LTG=STG 2/2 ELOS  Skilled Therapeutic Interventions/Progress Updates:    1:1 Pt received in the bed and reports all over pain (multiple sites). Pt agreeable to therapy. Pt able to ambulated with close supervision with SPC at a slow pace due to pain. Pt showered with left UE covered. Pt able to bathe with overall min A for buttocks and feet sit to stand. Pt reports she has a seat that already got delivered on the acute side but doesn't know if she has a cane but thinks there is another piece of equipment she got- will follow up with SW. Pt able to thread underwear but required A to pull up left side due to pain and can't reach (as well as buttocks). Pt able to don dress with contact guard to get over hospital bractlet/ IV. Pt able to perform grooming mod I and can move around w/c mod I. Pt left sitting up in w/c in prep for next session.    Therapy Documentation Precautions:  Precautions Precautions: Fall, Other (comment) Precaution/Restrictions Comments: L Shoulder Sling; OK  for gentle PROM/AROM of shoulder, digits, elbow Required Braces or Orthoses: Sling Restrictions Weight Bearing Restrictions Per Provider Order: Yes LUE Weight Bearing Per Provider Order: Weight bear through elbow only Other Position/Activity Restrictions: Assumed NWB until clarified by Dr  Pain: Pain Assessment Pain Scale: 0-10 Pain Score: 5  Pain Location: Arm/ shower and ice    Therapy/Group: Individual Therapy  Tamara Hicks [facility] 04/30/2024, 9:51 AM

## 2024-04-30 NOTE — Progress Notes (Signed)
 Physical Therapy Session Note  Patient Details  Name: Tamara Hicks MRN: [id number] Date of Birth: [birth year]  Today's Date: 04/30/2024 PT Individual Time: 1002-1100 and 1420-1450 PT Individual Time Calculation (min): 58 min and 30 min.  Short Term Goals: Week 1:  PT Short Term Goal 1 (Week 1): = LTGs due to ELOS  Skilled Therapeutic Interventions/Progress Updates:   First session:  Pt presents sitting in w/c and agreeable to therapy.  Rest breaks given for pain management.  Pt has most pain w/ ribs when sitting back in chair.  Pt transfers sit to stand w/ [facility] and supervision, occasional verbal cues for hand placement.  Pt amb up to 180' w/ CGA/Close supervision w/ improved speed from yesterday.  Pt states not using pain meds yesterday and encouraged to utilize for improved mobility w/ therapies.  Sling donned to LUE.  Pt performed Nu-step at Level 1 x 10 for LES only.  Pt amb to main gym w/ cues for posture and visual scanning.  Pt amb to room w/ SPC.  Pt remained sitting in w/c w/ all needs in reach.  Second session: Pt presents sitting in w/c and agreeable to therapy.  Pt transfers sit to stand w/ supervision and increased time.  Pt amb to main gym w/ SPC and close supervision/CGA, w/ 2 standing rest breaks 2/2 pain ribs.  Pt performs reciprocal gait pattern w/ proper advancement and placement of SPC.  Pt amb w/ increased speed.  Encouraged to utilize pain meds for improved mobility.  Pt negotiates 4 steps w/ step-to gait pattern.  Educated on progress to reciprocal pattern as able.  Pt uses R rail and CGA.  Pt amb to room and requested to lie down.  Pt transfers sit to supine using bed features.  LUE elevated on egg crate and pillow for comfort.  Bed alarm on and all needs in reach.      Therapy Documentation Precautions:  Precautions Precautions: Fall, Other (comment) Precaution/Restrictions Comments: L Shoulder Sling; OK  for gentle PROM/AROM of shoulder, digits, elbow Required  Braces or Orthoses: Sling Restrictions Weight Bearing Restrictions Per Provider Order: Yes LUE Weight Bearing Per Provider Order: Weight bear through elbow only Other Position/Activity Restrictions: Assumed NWB until clarified by Dr Diedre:   Vital Signs:   Pain: 5/10, 6/10 in PM session Pain Assessment Pain Scale: 0-10 Pain Score: 5  Pain Location: Generalized Pain Intervention(s): Medication (See eMAR)    Therapy/Group: Individual Therapy  Blimy Napoleon P Taequan Stockhausen 04/30/2024, 12:51 PM
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
## 2024-04-30 NOTE — Progress Notes (Signed)
 Patient ID: Tamara Hicks, female   DOB: 17-Feb-1971, 53 y.o.   MRN: [id number] Statement of service delivered.

## 2024-04-30 NOTE — Plan of Care (Signed)
  Problem: Consults Goal: RH GENERAL PATIENT EDUCATION Description: See Patient Education module for education specifics. Outcome: Progressing   Problem: RH BOWEL ELIMINATION Goal: RH STG MANAGE BOWEL WITH ASSISTANCE Description: STG Manage Bowel with mod I Assistance. Outcome: Progressing Goal: RH STG MANAGE BOWEL W/MEDICATION W/ASSISTANCE Description: STG Manage Bowel with Medication with mod I  Assistance. Outcome: Progressing   Problem: RH SAFETY Goal: RH STG ADHERE TO SAFETY PRECAUTIONS W/ASSISTANCE/DEVICE Description: STG Adhere to Safety Precautions With cues Assistance/Device. Outcome: Progressing   Problem: RH PAIN MANAGEMENT Goal: RH STG PAIN MANAGED AT OR BELOW PT'S PAIN GOAL Description: < 4 with prns Outcome: Progressing   Problem: RH KNOWLEDGE DEFICIT GENERAL Goal: RH STG INCREASE KNOWLEDGE OF SELF CARE AFTER HOSPITALIZATION Description: Patient will be able to manage care at discharge using educational resources independently Outcome: Progressing
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
## 2024-05-01 ENCOUNTER — Inpatient Hospital Stay (HOSPITAL_COMMUNITY)

## 2024-05-01 DIAGNOSIS — T07XXXA Unspecified multiple injuries, initial encounter: Secondary | ICD-10-CM | POA: Diagnosis not present

## 2024-05-01 NOTE — Progress Notes (Signed)
 Occupational Therapy Session Note  Patient Details  Name: Tamara Hicks MRN: [id number] Date of Birth: June 20, 1971  Today's Date: 05/01/2024 OT Individual Time: 9266-9156 OT Individual Time Calculation (min): 70 min    Short Term Goals: Week 1:  OT Short Term Goal 1 (Week 1): LTG=STG 2/2 ELOS  Skilled Therapeutic Interventions/Progress Updates:   Patient received awake, supported sitting in bed.  Patient agreeable to OT session.  Patient sat up from elevated bed with increased time, then stepped into shoes and walked to bathroom.  Patient able to complete toileting with contact guard, then supervision.  Walked to shower with close supervision.  Sat to shower, reaching to feet by crossing legs.  Patient able to stand and lift one leg to shower seat as needed without loss of balance.  Moves slowly and cautiously throughout.  Dressed while seated on shower bench.  Walked to sink and stood to brush teeth.  Sat to don sling with mod assist, and walked to gym to address shoulder , elbow, trunk and digit gentle active motion.  Patient needing cueing to relax into movement to reduce guarding, and positionally for shoulders, trunk. Neck.   Returned to room and left up in wheelchair with personal item in reach.    Therapy Documentation Precautions:  Precautions Precautions: Fall, Other (comment) Precaution/Restrictions Comments: L Shoulder Sling; OK  for gentle PROM/AROM of shoulder, digits, elbow Required Braces or Orthoses: Sling Restrictions Weight Bearing Restrictions Per Provider Order: Yes LUE Weight Bearing Per Provider Order: Weight bear through elbow only Other Position/Activity Restrictions: Assumed NWB until clarified by Dr Diedre:   Vital Signs: Therapy Vitals Temp: 97.6 F (36.4 C) Pulse Rate: 75 Resp: 16 BP: 108/69 Patient Position (if appropriate): Lying Oxygen Therapy SpO2: 95 % O2 Device: Room Air Pain:  5-6 - ribs and tailbone Repositioned and  showered     Therapy/Group: Individual Therapy  Tekoa Amon M 05/01/2024, 8:45 AM
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
## 2024-05-01 NOTE — Progress Notes (Signed)
 Physical Therapy Session Note  Patient Details  Name: Tamara Hicks MRN: [id number] Date of Birth: [birth year]  Today's Date: 05/01/2024 PT Individual Time: 1407-1510 PT Individual Time Calculation (min): 63 min   Short Term Goals: Week 1:  PT Short Term Goal 1 (Week 1): = LTGs due to ELOS  Skilled Therapeutic Interventions/Progress Updates:  Patient supine in bed on entrance to room. Patient alert and agreeable to PT session.   Patient with 3/10 pain complaint at start of session. Is prepared for increase in pain with mobility. Session focused on coordinated breathing with movements in order to decrease overall intraabdominal/ thoracic pressure to reduce pain in abdomen as well as at areas of rib fractures.   Therapeutic Activity: Bed Mobility: Pt performed supine > sit with supervision. VC/ tc required for increased abdominal activation prior to movement and exhale with efforts. Requires CGA for BLE back to bed surface in order to return to supine at end of session.  Transfers: Pt performed sit<>stand transfers with extra time and close supervision with intermittent CGA in times of increased pressure. Stand pivot transfers are easier for pt once upright and completed with distant supervision.   Gait Training:  Pt ambulated 142'x2/ 171'x2 reaching main gym for seated rest break, then completing distance. Pt using no AD and with overall supervision. Demonstrated slow but consistent pace throughout. Gait speed clocked at 0.264 m/s demonstrating pt indicative of being a household ambulator at this time.   Neuromuscular Re-ed: NMR facilitated during session with focus on standing balance, confidence in balance, coordinated breathing  and abdominal facilitation with global movements. Pt guided in collection of bean bags from positions to each side from moderately low height. Then toss of bag to target board placed 12 ft away. Small arm movements for toss initially that improve throughout  session with confidence in dynamic/ reactive balance. VC throughout for coordinated exhale with knee flexion to lower instead of forward trunk flexion to reach bags d/t rib fx pain. Improves throughout. Requires 3 rest breaks during activity for pain/ fatigue.   NMR performed for improvements in motor control and coordination, balance, sequencing, judgement, and self confidence/ efficacy in performing all aspects of mobility at highest level of independence.   Therapeutic Exercise: Pt guided in continuous reciprocation of BLE only using NuStep L4 x with focus on maintaining smooth reciprocation and abdominal breathing throughout. Maintained 23-35 steps/ min with average of 1.5 METs covering 0.3 miles. Slight soreness noted after completion but soreness is manageable.   Patient requires time to return to supine d/t increased pain with transitioning to stand>sit>supine.  Pillows positioned for comfort in bed at end of session with brakes locked, bed alarm set, and all needs within reach.   Therapy Documentation Precautions:  Precautions Precautions: Fall, Other (comment) Precaution/Restrictions Comments: L Shoulder Sling; OK  for gentle PROM/AROM of shoulder, digits, elbow Required Braces or Orthoses: Sling Restrictions Weight Bearing Restrictions Per Provider Order: Yes LUE Weight Bearing Per Provider Order: Weight bear through elbow only Other Position/Activity Restrictions: Assumed NWB until clarified by Dr  Pain:  Pain 3/10 at rest at start of session and increases to 8/10 with certain movements throughout session. Addressed with coordinated breath training and adjusting movements to decrease intra-abdominal/ thoracic pressure.   Therapy/Group: Individual Therapy  Mliss DELENA Milliner PT, DPT, CSRS 05/01/2024, 6:17 PM

## 2024-05-01 NOTE — Progress Notes (Signed)
 Patient ID: Tamara Hicks, female   DOB: [birth year], 53 y.o.   MRN: [id number]  DME order placed

## 2024-05-01 NOTE — Progress Notes (Signed)
 Occupational Therapy Session Note  Patient Details  Name: Tamara Hicks MRN: [id number] Date of Birth: 1971-03-30  Today's Date: 05/01/2024 OT Individual Time: 1035-1130 OT Individual Time Calculation (min): 55 min    Short Term Goals: Week 1:  OT Short Term Goal 1 (Week 1): LTG=STG 2/2 ELOS  Skilled Therapeutic Interventions/Progress Updates:  Skilled OT intervention completed with focus on DME education, functional mobility, activity tolerance and LUE ROM. Pt received upright in bed, agreeable to session. 7/10 pain reported L wrist, sternum and R ribs; pre-medicated. OT offered rest breaks and repositioning throughout for pain reduction.  Pt reported bed mobility being difficult for her- feeling dependent on elevated HOB and bed rail for assist. Discussed option of using recliner for elevation or purchasing bed rail to assist with power up of trunk. Able to transition EOB with HOB to 45 degrees with supervision. Able to donn sling with supervision/cues on LUE. Donned slip on shoes set up A.   Utilized [facility] for all mobility, and required CGA/supervision for all sit > stands and ambulatory transfers up to 75 ft during session. Pt with slow paced with gait but with generalized slowness due to pain with all tasks.  Doffed sling independently. Pt with questions about sling wear- discussed she could leave off for as long as tolerable for passive stretch especially with mobility and in bed, but for proximal support if pain is present to have elbow properly supported and demo provided. Pt completed the following exercises to improve functional ROM in a pain free zone needed for independence with BADLs: AAROM (with PVC pipe) -BUE shoulder flexion- pt tolerating to 90 degrees -LUE shoulder abduction- pt tolerating to 100 degrees AROM -LUE external rotation- WNL  Discussed shower set up being on 2nd floor- with walk in shower and door. Advised pt to have shower chair (ordered on acute already  supposedly) for energy conservation and for LUE integrity and fall prevention. Ambulated back to room without sling on- stretch reported but a good stretch. Transitioned EOB > sitting upright with supervision though time due to pain. Pt remained upright in bed, with bed alarm on/activated, and with all needs in reach at end of session.   Therapy Documentation Precautions:  Precautions Precautions: Fall, Other (comment) Precaution/Restrictions Comments: L Shoulder Sling; OK  for gentle PROM/AROM of shoulder, digits, elbow Required Braces or Orthoses: Sling Restrictions Weight Bearing Restrictions Per Provider Order: Yes LUE Weight Bearing Per Provider Order: Weight bear through elbow only Other Position/Activity Restrictions: Assumed NWB until clarified by Dr    [name]: Individual Therapy  Lorrayne FORBES Fritter, MS, OTR/L  05/01/2024, 12:22 PM

## 2024-05-02 ENCOUNTER — Inpatient Hospital Stay (HOSPITAL_COMMUNITY)

## 2024-05-02 ENCOUNTER — Other Ambulatory Visit (HOSPITAL_COMMUNITY): Payer: Self-pay

## 2024-05-02 DIAGNOSIS — T07XXXA Unspecified multiple injuries, initial encounter: Secondary | ICD-10-CM | POA: Diagnosis not present

## 2024-05-02 DIAGNOSIS — K224 Dyskinesia of esophagus: Secondary | ICD-10-CM | POA: Insufficient documentation

## 2024-05-02 DIAGNOSIS — F411 Generalized anxiety disorder: Secondary | ICD-10-CM

## 2024-05-02 DIAGNOSIS — E039 Hypothyroidism, unspecified: Secondary | ICD-10-CM | POA: Insufficient documentation

## 2024-05-02 LAB — CBC WITH DIFFERENTIAL/PLATELET
Abs Immature Granulocytes: 0.08 K/uL — ABNORMAL HIGH (ref 0.00–0.07)
Basophils Absolute: 0.1 K/uL (ref 0.0–0.1)
Basophils Relative: 1 %
Eosinophils Absolute: 0.2 K/uL (ref 0.0–0.5)
Eosinophils Relative: 4 %
HCT: 36.2 % (ref 36.0–46.0)
Hemoglobin: 11.9 g/dL — ABNORMAL LOW (ref 12.0–15.0)
Immature Granulocytes: 1 %
Lymphocytes Relative: 22 %
Lymphs Abs: 1.3 K/uL (ref 0.7–4.0)
MCH: 30.3 pg (ref 26.0–34.0)
MCHC: 32.9 g/dL (ref 30.0–36.0)
MCV: 92.1 fL (ref 80.0–100.0)
Monocytes Absolute: 0.5 K/uL (ref 0.1–1.0)
Monocytes Relative: 9 %
Neutro Abs: 3.6 K/uL (ref 1.7–7.7)
Neutrophils Relative %: 63 %
Platelets: 271 K/uL (ref 150–400)
RBC: 3.93 MIL/uL (ref 3.87–5.11)
RDW: 12.9 % (ref 11.5–15.5)
WBC: 5.8 K/uL (ref 4.0–10.5)
nRBC: 0 % (ref 0.0–0.2)

## 2024-05-02 LAB — COMPREHENSIVE METABOLIC PANEL WITH GFR
ALT: 50 U/L — ABNORMAL HIGH (ref 0–44)
AST: 23 U/L (ref 15–41)
Albumin: 3.1 g/dL — ABNORMAL LOW (ref 3.5–5.0)
Alkaline Phosphatase: 88 U/L (ref 38–126)
Anion gap: 7 (ref 5–15)
BUN: 15 mg/dL (ref 6–20)
CO2: 25 mmol/L (ref 22–32)
Calcium: 9.4 mg/dL (ref 8.9–10.3)
Chloride: 106 mmol/L (ref 98–111)
Creatinine, Ser: 0.8 mg/dL (ref 0.44–1.00)
GFR, Estimated: >60 mL/min (ref >60)
Glucose, Bld: 111 mg/dL — ABNORMAL HIGH (ref 70–99)
Potassium: 4 mmol/L (ref 3.5–5.1)
Sodium: 138 mmol/L (ref 135–145)
Total Bilirubin: 0.8 mg/dL (ref 0.0–1.2)
Total Protein: 5.6 g/dL — ABNORMAL LOW (ref 6.5–8.1)

## 2024-05-02 MED ORDER — CYCLOBENZAPRINE HCL 10 MG PO TABS
10.0000 mg | ORAL_TABLET | Freq: Three times a day (TID) | ORAL | 0 refills | Status: AC
  Filled 2024-05-02: qty 30, 10d supply, fill #0

## 2024-05-02 MED ORDER — OXYCODONE HCL 10 MG PO TABS
10.0000 mg | ORAL_TABLET | ORAL | 0 refills | Status: AC | PRN
  Filled 2024-05-02: qty 30, 5d supply, fill #0

## 2024-05-02 MED ORDER — ZINC SULFATE 220 (50 ZN) MG PO CAPS
220.0000 mg | ORAL_CAPSULE | Freq: Every day | ORAL | Status: DC
  Administered 2024-05-03 – 2024-05-07 (×5): 220 mg via ORAL
  Filled 2024-05-02 (×5): qty 1

## 2024-05-02 MED ORDER — LIDOCAINE 5 % EX PTCH
2.0000 | MEDICATED_PATCH | CUTANEOUS | 0 refills | Status: AC
  Filled 2024-05-02 – 2024-05-03 (×2): qty 30, 15d supply, fill #0

## 2024-05-02 MED ORDER — MELATONIN 3 MG PO TABS
3.0000 mg | ORAL_TABLET | Freq: Every day | ORAL | Status: DC
  Administered 2024-05-02 – 2024-05-06 (×5): 3 mg via ORAL
  Filled 2024-05-02 (×5): qty 1

## 2024-05-02 MED ORDER — L-METHYLFOLATE-B6-B12 3-35-2 MG PO TABS
1.0000 | ORAL_TABLET | Freq: Every day | ORAL | Status: DC
  Administered 2024-05-02 – 2024-05-07 (×6): 1 via ORAL
  Filled 2024-05-02 (×6): qty 1

## 2024-05-02 MED ORDER — PANTOPRAZOLE SODIUM 40 MG PO TBEC
40.0000 mg | DELAYED_RELEASE_TABLET | Freq: Two times a day (BID) | ORAL | 0 refills | Status: DC
  Filled 2024-05-02: qty 60, 30d supply, fill #0

## 2024-05-02 MED ORDER — ALUM & MAG HYDROXIDE-SIMETH 200-200-20 MG/5ML PO SUSP
30.0000 mL | ORAL | 0 refills | Status: AC | PRN
  Filled 2024-05-02: qty 355, 2d supply, fill #0

## 2024-05-02 MED ORDER — DICLOFENAC SODIUM 1 % EX GEL
4.0000 g | Freq: Three times a day (TID) | CUTANEOUS | 0 refills | Status: AC | PRN
Start: 2024-05-02 — End: ?
  Filled 2024-05-02: qty 100, 30d supply, fill #0

## 2024-05-02 MED ORDER — POLYETHYLENE GLYCOL 3350 17 GM/SCOOP PO POWD
17.0000 g | Freq: Two times a day (BID) | ORAL | 0 refills | Status: AC
  Filled 2024-05-02: qty 238, 7d supply, fill #0

## 2024-05-02 MED ORDER — IOHEXOL 350 MG/ML SOLN
75.0000 mL | Freq: Once | INTRAVENOUS | Status: AC | PRN
  Administered 2024-05-02: 75 mL via INTRAVENOUS

## 2024-05-02 NOTE — Progress Notes (Signed)
 Physical Therapy Session Note  Patient Details  Name: Tamara Hicks MRN: [id number] Date of Birth: 10-31-1970  Today's Date: 05/02/2024 PT Individual Time: 9196-9154 PT Individual Time Calculation (min): 42 min   Short Term Goals: Week 1:  PT Short Term Goal 1 (Week 1): = LTGs due to ELOS  Skilled Therapeutic Interventions/Progress Updates: Pt presented in bed completing breakfast and agreeable to therapy. Pt sates pain 6/10 generalized, premedicated and rest and repositioning provided as needed. Pt requesting to use bathroom prior. Completed sit to stand with supervision and pt was able to obtain [facility]. Pt ambulated to bathroom with supervision and completed clothing management with supervision as well as toilet transfers (+ urinary void/BM). Pt completed peri-care with supervision and once completed ambulated to sink to complete hand hygiene. Pt then ambulated to day room with [facility] and only x 1 seated rest break. Pt with extended seated rest break in day room with pt discussing options for home set up. Per pt biggest challenge at this time appears to be bed mobility. Discussed option of purchsing wedge for bed which may be more feasible as pt does not have recliner. Discussed continuing to work on bed mobility during stay but advised having wedge at current due to rib fx would be less painful than flat bed, pt verbalized understanding. Pt ambulated back to room with supervision and without rest breaks. In room pt completed sit to supine with HOB significantly elevated and pain most noted when bringing RLE onto bed (although pt was able to do without assist). Pt left resting in bed at end of session with call bell within reach and needs met.      Therapy Documentation Precautions:  Precautions Precautions: Fall, Other (comment) Precaution/Restrictions Comments: L Shoulder Sling; OK  for gentle PROM/AROM of shoulder, digits, elbow Required Braces or Orthoses: Sling Restrictions Weight Bearing  Restrictions Per Provider Order: Yes LUE Weight Bearing Per Provider Order: Weight bear through elbow only Other Position/Activity Restrictions: Assumed NWB until clarified by Dr Diedre:   Vital Signs:   Pain: Pain Assessment Pain Scale: 0-10 Pain Score: 4  Pain Location: Generalized Pain Intervention(s): Medication (See eMAR)  Therapy/Group: Individual Therapy  Ryn Peine 05/02/2024, 3:33 PM

## 2024-05-02 NOTE — Progress Notes (Signed)
 Physical Therapy Session Note  Patient Details  Name: Tamara Hicks MRN: [id number] Date of Birth: 1971-03-28  Today's Date: 05/02/2024 PT Individual Time: 0930-1039 PT Individual Time Calculation (min): 69 min   Short Term Goals: Week 1:  PT Short Term Goal 1 (Week 1): = LTGs due to ELOS  Skilled Therapeutic Interventions/Progress Updates:   Received pt semi-reclined in bed, pt agreeable to PT treatment, and reported pain 4/10 in ribs and L shoulder (premedicated). Session with emphasis on functional mobility/transfers, generalized strengthening and endurance, dynamic standing balance/coordination, ambulation, NMR, and stair navigation. Discussed bed mobility and pt ultimately requesting hospital bed - CSW notified. Pt transferred semi-reclined<>sitting R EOB with HOB elevated to 47 degrees and use of bedrails with CGA. Pt unable to tolerate lowering bed any further due to pain.  Pt performed all transfers with [facility] and supervision throughout session. Pt ambulated 145ft x 2 trials with [facility] and supervision to/from main therapy gym. Pt navigated 8 6in steps with L handrail and CGA fading to close supervision using a lateral stepping technique - limited by pain. Pt reports 1 8in step to enter home with bilateral handrails - pt navigated 1 8in curb with R handrail and close supervision to simulate home entry.   Pt performed the following activities on [employer] with emphasis on dynamic standing balance/coordination, proprioception, and spatial awareness: -weight shift training on level static with RUE support fading to no UE support for 2.5 minutes -limits of stability training on level static with RUE support fading to no UE support for 1.5 minutes x 1 and 1 minute and 18 seconds x 1 - limited by lightheadedness and clamminess  Pt reporting intermittent neck pain - instructed in the following cervical exercises/stretches: -upper trapezius stretch 2x20 seconds bilaterally - pt reporting feeling  cold sweats after stretch that resolved with rest -levator scapulae stretch 2x20 seconds bilaterally -posterior shoulder rolls 2x12 Discussed moving up D/C date to Sunday and discussed options for bed if hospital bed isn't covered. Pt reports she could get lift chair and also discussed using wedge. Returned to room and concluded session with pt sitting in WC at sink cleaning teeth with NT changing bed linens.   Therapy Documentation Precautions:  Precautions Precautions: Fall, Other (comment) Precaution/Restrictions Comments: L Shoulder Sling; OK  for gentle PROM/AROM of shoulder, digits, elbow Required Braces or Orthoses: Sling Restrictions Weight Bearing Restrictions Per Provider Order: Yes LUE Weight Bearing Per Provider Order: Weight bear through elbow only Other Position/Activity Restrictions: Assumed NWB until clarified by Dr  [name]: Individual Therapy Therisa HERO Zaunegger Therisa Stains PT, DPT 05/02/2024, 6:27 AM

## 2024-05-02 NOTE — Discharge Instructions (Addendum)
 Inpatient Rehab Discharge Instructions  Tamara Hicks Discharge date and time: 05/06/2024 10:45 AM   Activities/Precautions/ Functional Status: Activity: no lifting, driving, or strenuous exercise for UNTIL CLEARED BY MD and no driving while on analgesics Diet: regular diet Wound Care: keep wound clean and dry and reinforce dressing PRN Functional status:  ___ No restrictions     ___ Walk up steps independently ___ 24/7 supervision/assistance   ___ Walk up steps with assistance ___ Intermittent supervision/assistance  ___ Bathe/dress independently ___ Walk with walker     ___ Bathe/dress with assistance ___ Walk Independently    ___ Shower independently ___ Walk with assistance    ___ Shower with assistance __x_ No alcohol     ___ Return to work/school ________  Special Instructions:  -No driving smoking or alcohol or illicit drug use    -maintain soft wrist splint and arm sling--No heavy lifting until cleared     COMMUNITY REFERRALS UPON DISCHARGE:    Outpatient: PT+OT   Other:Home Exercise Program [facility] [address] [place], [place] [address] Phone: [phone]  Will receive a call to schedule follow-up appointment                        Medical Equipment/Items Ordered:Tub seat                                                 Agency/Supplier:[employer]  GENERAL COMMUNITY RESOURCES FOR PATIENT/FAMILY: Employment Assistance: FMLA    My questions have been answered and I understand these instructions. I will adhere to these goals and the provided educational materials after my discharge from the hospital.  Patient/Caregiver Signature _______________________________ Date __________  Clinician Signature _______________________________________ Date __________  Please bring this form and your medication list with you to all your follow-up doctor's appointments. Inpatient Rehab Discharge Instructions

## 2024-05-02 NOTE — Plan of Care (Signed)
  Problem: Consults Goal: RH GENERAL PATIENT EDUCATION Description: See Patient Education module for education specifics. Outcome: Progressing   Problem: RH SAFETY Goal: RH STG ADHERE TO SAFETY PRECAUTIONS W/ASSISTANCE/DEVICE Description: STG Adhere to Safety Precautions With cues Assistance/Device. Outcome: Progressing
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
## 2024-05-02 NOTE — Progress Notes (Addendum)
 Occupational Therapy Session Note  Patient Details  Name: Tamara Hicks MRN: [id number] Date of Birth: [birth year]  Today's Date: 05/02/2024 OT Individual Time: 8645-8541 OT Individual Time Calculation (min): 64 min  11 min missed as pt off unit for procedure   Short Term Goals: Week 1:  OT Short Term Goal 1 (Week 1): LTG=STG 2/2 ELOS  Skilled Therapeutic Interventions/Progress Updates:  Pt greeted seated in w/c, pt agreeable to OT intervention.      Transfers/bed mobility/functional mobility: pt completed all functional ambulation with SPC and CGA.    ADLs:   Transfers: pt completed ambulatory transfer to low couch in apt with SPC and CGA. Pt reports low couch at home. Recommended stacking up blankets on surface to elevate height. Pt also completed ambulatory transfer to TTB, pt with difficulty scooting in towards affected LUE. Recommended pt scoot on towel at home to L side to decrease friction.    IADLS: Pt completed simulate IADL task of collecting items around the gym at waist level or below to simulate IADL task of cleaning up. Pt completed task with SPC and CGA, no LOB.  Pt able to then stand on airex cushion to fold towels with a focus on [facility] and dynamic balance, pt completed task with no LOB and CGA.   Education:  Discussed DME needs in depth with pt shown various options of shower seats and uses of BSC. Pt reports all showers are upstairs but per pt report, pt was able to complete stair training today with PT. At this time, this OTA would recommend use of TTB in tub shower. Pt was agreeable of DME. Discussed benefits of hospital bed as pt greatly relies on bed features at this time d/t pain. Did discuss less invasive options such as bed rail however pt continues to feel like she would need a hospital bed.                  Ended session with pt in bed, handed off directly to transport for angiogram, pt missed 11 mins of session d/t procedure.    Therapy  Documentation Precautions:  Precautions Precautions: Fall, Other (comment) Precaution/Restrictions Comments: L Shoulder Sling; OK  for gentle PROM/AROM of shoulder, digits, elbow Required Braces or Orthoses: Sling Restrictions Weight Bearing Restrictions Per Provider Order: Yes LUE Weight Bearing Per Provider Order: Weight bear through elbow only Other Position/Activity Restrictions: Assumed NWB until clarified by Dr General: General OT Amount of Missed Time: 11 Minutes  Pain: 8/10 pain reported in neck, back and RUE. Provided ice for swollen RUE ( nurse aware) and heat pack to sore neck, pain meds given during session.     Therapy/Group: Individual Therapy  Ronal Mallie Needy 05/02/2024, 3:21 PM

## 2024-05-02 NOTE — Progress Notes (Signed)
 Orthopedic Tech Progress Note Patient Details:  Tamara Hicks Sep 12, 1971 [id number]  Ortho Devices Type of Ortho Device: Wrist splint Ortho Device/Splint Location: LUE Ortho Device/Splint Interventions: Ordered      Woodfin Kiss A [name] 05/02/2024, 8:57 AM
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
## 2024-05-02 NOTE — Discharge Summary (Incomplete)
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 List     STOP taking these medications    Dexilant 60 MG capsule Generic drug: dexlansoprazole   meloxicam 15 MG tablet Commonly known as: MOBIC   ondansetron  4 MG disintegrating tablet Commonly known as: ZOFRAN -ODT   phenazopyridine  95 MG tablet Commonly known as: PYRIDIUM        TAKE these medications    acetaminophen  325 MG tablet Commonly known as: TYLENOL  Take 1 tablet (325 mg total) by mouth every 6 (six) hours as needed for moderate pain (pain score 4-6).   alum & mag hydroxide-simeth 200-200-20 MG/5ML suspension Commonly known as: MAALOX/MYLANTA Take 30 mLs by mouth every 4 (four) hours as needed for indigestion.   cetirizine  10 MG tablet Commonly known as: ZYRTEC  Take 1 tablet (10 mg total) by mouth daily.   CRANBERRY PO Take 1 capsule by mouth daily.   cyclobenzaprine  10 MG tablet Commonly known as: FLEXERIL  Take 1 tablet (10 mg total) by mouth 3 (three) times daily. What changed:  when to take this reasons to take this   diclofenac  Sodium 1 % Gel Commonly known as: VOLTAREN  Apply 4 g topically 3 (three) times daily as needed.   fluticasone  50 MCG/ACT nasal spray Commonly known as: FLONASE  Place 2 sprays into both nostrils daily. What changed:  when to take  this reasons to take this   ibuprofen  600 MG tablet Commonly known as: ADVIL  Take 1 tablet (600 mg total) by mouth 3 (three) times daily.   levothyroxine  50 MCG tablet Commonly known as: SYNTHROID  Take 50 mcg by mouth daily before breakfast.   lidocaine  5 % Commonly known as: LIDODERM  Place 2 patches onto the skin daily. Remove & Discard patch within 12 hours or as directed by MD   LORazepam  0.5 MG tablet Commonly known as: ATIVAN  Take 0.5 mg by mouth daily as needed (severe anxiety).   losartan  25 MG tablet Commonly known as: COZAAR  Take 25 mg by mouth daily.   MEGARED OMEGA-3 KRILL OIL PO Take 1 capsule by mouth daily.   mesalamine  1.2 g EC tablet Commonly known as: LIALDA  Take 1.2 g by mouth daily as needed (colitis flare, IBS).   Oxycodone  HCl 10 MG Tabs Take 1-1.5 tablets (10-15 mg total) by mouth every 4 (four) hours as needed for severe pain (pain score 7-10) or moderate pain (pain score 4-6) (10 mg for moderate, 15 mg for severe). Notes to patient: LIMIT USE TO 1 TABLET AS NEEDED    pantoprazole  20 MG tablet Commonly known as: PROTONIX  Take 1 tablet (20 mg total) by mouth daily.   polyethylene glycol powder 17 GM/SCOOP powder Commonly known as: GLYCOLAX /MIRALAX  Take 1 capful (17 g) with water by mouth 2 (two) times daily.   PROBIOTIC PO Take 1 capsule by mouth daily.   torsemide  10 MG tablet Commonly known as: DEMADEX  Take 10 mg by mouth daily as needed (fluid, swelling).   VITAMIN D-3 PO Take 1 capsule by mouth daily.        Follow-up Information     Elizbeth Leita Ruth, FNP Follow up.   Specialty: Endocrinology Why: Call for an appointment. Contact information: [address] [employer]  [place] [address] [phone]         Celena Sharper, MD Follow up.   Specialty: Orthopedic Surgery Why: Call for an appointment. Contact information: [address] [place] [address] [phone]                 Signed: Daphne LITTIE Finders 05/07/2024, 11:10 AM

## 2024-05-03 ENCOUNTER — Other Ambulatory Visit (HOSPITAL_COMMUNITY): Payer: Self-pay

## 2024-05-03 ENCOUNTER — Telehealth (HOSPITAL_COMMUNITY): Payer: Self-pay | Admitting: Pharmacy Technician

## 2024-05-03 ENCOUNTER — Other Ambulatory Visit (HOSPITAL_COMMUNITY)

## 2024-05-03 ENCOUNTER — Inpatient Hospital Stay (HOSPITAL_COMMUNITY)

## 2024-05-03 DIAGNOSIS — T07XXXA Unspecified multiple injuries, initial encounter: Secondary | ICD-10-CM | POA: Diagnosis not present

## 2024-05-03 DIAGNOSIS — Z9889 Other specified postprocedural states: Secondary | ICD-10-CM

## 2024-05-03 MED ORDER — ACETAMINOPHEN 325 MG PO TABS
325.0000 mg | ORAL_TABLET | Freq: Four times a day (QID) | ORAL | Status: DC | PRN
  Administered 2024-05-03 – 2024-05-04 (×2): 325 mg via ORAL
  Filled 2024-05-03 (×2): qty 1

## 2024-05-03 MED ORDER — PANTOPRAZOLE SODIUM 40 MG PO TBEC
40.0000 mg | DELAYED_RELEASE_TABLET | Freq: Every day | ORAL | Status: DC
  Administered 2024-05-04 – 2024-05-05 (×2): 40 mg via ORAL
  Filled 2024-05-03 (×2): qty 1

## 2024-05-03 MED ORDER — PANTOPRAZOLE SODIUM 40 MG PO TBEC
40.0000 mg | DELAYED_RELEASE_TABLET | Freq: Every day | ORAL | 0 refills | Status: DC
  Filled 2024-05-03: qty 30, 30d supply, fill #0

## 2024-05-03 MED ORDER — ACETAMINOPHEN 325 MG PO TABS: 325.0000 mg | ORAL_TABLET | Freq: Four times a day (QID) | ORAL | Status: AC | PRN

## 2024-05-03 NOTE — Progress Notes (Signed)
 Occupational Therapy Session Note  Patient Details  Name: Tamara Hicks MRN: [id number] Date of Birth: 07-Nov-1970  Today's Date: 05/03/2024 OT Missed Time: 45 Minutes Missed Time Reason: Other (comment) (having abdominal ultrasound completed)   Short Term Goals: Week 1:  OT Short Term Goal 1 (Week 1): LTG=STG 2/2 ELOS  Skilled Therapeutic Interventions/Progress Updates:     Attempted to see Pt for PM OT session, however Pt having abdominal ultrasound and unavailable to participate in session at this time. Will attempt to make up time as Pts status and schedule allow. Missed 45 min skilled OT treatment.    Therapy Documentation Precautions:  Precautions Precautions: Fall, Other (comment) Precaution/Restrictions Comments: L Shoulder Sling; OK  for gentle PROM/AROM of shoulder, digits, elbow Required Braces or Orthoses: Sling Restrictions Weight Bearing Restrictions Per Provider Order: Yes LUE Weight Bearing Per Provider Order: Weight bear through elbow only Other Position/Activity Restrictions: Assumed NWB until clarified by Dr   [name]: Individual Therapy  [name] 05/03/2024, 7:55 AM
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
## 2024-05-03 NOTE — Progress Notes (Signed)
 Occupational Therapy Session Note  Patient Details  Name: Tamara Hicks MRN: [id number] Date of Birth: [birth year]  Today's Date: 05/03/2024 OT Individual Time: 0920-1015 OT Individual Time Calculation (min): 55 min    Short Term Goals: Week 1:  OT Short Term Goal 1 (Week 1): LTG=STG 2/2 ELOS  Skilled Therapeutic Interventions/Progress Updates:  Skilled OT intervention completed with focus on ADL retraining, functional endurance, and mobility within a shower context. Pt received upright in bed, agreeable to session. 5/10 pain reported in LUE; nurse notified of pain med request-medicated. OT offered rest breaks, repositioning and moist heat via shower for pain relief.  Pt completed all sit > stands and ambulatory transfers with supervision using SPC. Required increased time for bed mobility due to pain but with HOB elevated, was mod I.  Waterproof cover applied to LUE splint prior to shower. Pt was able to bathe all parts with min A for RUE only but pt could be set up A if LUE didn't need to be covered. Discussed utilizing long handled sponge for back region. Cues needed for drying body/feet off prior to exiting shower. Ambulated > BSC for dressing. Able to donn gown with set up A. Foam dressing changed on L clavicle region. Discussed how to manage this at home after shower.  Ambulated > gym. Discussed follow up OPOT, however pt reporting she doesn't anticipate attending due to transportation challenges initially. Issued pt an HEP for LUE A/AA ROM with plan for next OT to review with pt. Ambulated > room, transitioned sitting EOB > upright in bed with mod I. Pt remained upright in bed, with bed alarm on/activated, and with all needs in reach at end of session.   Therapy Documentation Precautions:  Precautions Precautions: Fall, Other (comment) Precaution/Restrictions Comments: L Shoulder Sling; OK  for gentle PROM/AROM of shoulder, digits, elbow Required Braces or Orthoses:  Sling Restrictions Weight Bearing Restrictions Per Provider Order: Yes LUE Weight Bearing Per Provider Order: Weight bear through elbow only Other Position/Activity Restrictions: Assumed NWB until clarified by Dr    [name]: Individual Therapy  Javonne Dorko E Payam Gribble, MS, OTR/L  05/03/2024, 11:30 AM

## 2024-05-03 NOTE — Progress Notes (Signed)
 Physical Therapy Session Note  Patient Details  Name: Tamara Hicks MRN: [id number] Date of Birth: 1970-11-06  Today's Date: 05/03/2024 PT Individual Time: 8883-8843 and 1345-1425 PT Individual Time Calculation (min): 40 min and 40 min  Short Term Goals: Week 1:  PT Short Term Goal 1 (Week 1): = LTGs due to ELOS  Skilled Therapeutic Interventions/Progress Updates:   Treatment Session 1 Received pt semi-reclined in bed with RN at bedside. Pt reports finding small lump on R medical breast tissue/pectoral muscle - MD notified. Per RN, pt NPO with plan for ultrasound of abdomen today. Pt asking for explanation of medical terminology of CT results and therapist assisted. Pt agreeable to PT treatment and reported pain R ribcage and L wrist rated 3/10. Session with emphasis on discharge planning. functional mobility/transfers, generalized strengthening and endurance, dynamic standing balance/coordination, stair navigation, and ambulation. Went through sensation, MMT, and pain interference questionnaire in preparation for discharge.   Pt transferred semi-reclined<>sitting R EOB with HOB at 50 degrees and use of bedrails with supervision. Pt performed all transfers with [facility] and supervision throughout session. Pt ambulated 126ft with SPC and supervision to main therapy gym and navigated 12 6in steps with L handrail and supervision alternating ascending and descending with a step through and step to pattern. Pt then ambulated >275ft with SPC and supervision back to room. Transitioned into supine with supervision and concluded session with pt semi-reclined in bed, needs within reach, and bed alarm on.   Treatment Session 2 Received pt semi-reclined in bed still waiting to be taken for ultrasound. Pt agreeable to PT treatment and reported pain 4/10 in L shoulder, ribs, and neck. Session with emphasis on functional mobility/transfers, generalized strengthening and endurance, dynamic standing  balance/coordination, and ambulation. Pt performed bed mobility with HOB elevated and use of bedrails with supervision x 2 trials throughout session. Pt performed all transfers with [facility] and supervision throughout session. Pt ambulated >344ft x 2 trials with SPC and supervision to/from dayroom. Pt performed seated BLE strengthening on Nustep at workload 2 increasing to 5 for 8 minutes on Pace Partner - total of 366 steps, 0.2 miles, with SPM 45 but with difficulty keeping up with target. Pt then ambulated 170ft without AD and supervision. Pt with no LOB but ambulates at decreased cadence with narrow BOS. Returned to room and concluded session with pt semi-reclined in bed, needs within reach, and bed alarm on.    Therapy Documentation Precautions:  Precautions Precautions: Fall, Other (comment) Precaution/Restrictions Comments: L Shoulder Sling; OK  for gentle PROM/AROM of shoulder, digits, elbow Required Braces or Orthoses: Sling Restrictions Weight Bearing Restrictions Per Provider Order: Yes LUE Weight Bearing Per Provider Order: Weight bear through elbow only Other Position/Activity Restrictions: Assumed NWB until clarified by Dr  [name]: Individual Therapy Therisa HERO Zaunegger Therisa Stains PT, DPT 05/03/2024, 6:21 AM

## 2024-05-03 NOTE — Progress Notes (Signed)
 Physical Therapy Note  Patient Details  Name: Tamara Hicks MRN: [id number] Date of Birth: 05-10-1971 Today's Date: 05/03/2024   Recommending the following equipment to increase functional independence with mobility: ~ Hospital bed - pt is currently unable to tolerate lying <45 degrees due to neck, rib, and L shoulder pain. Pt relies heavily on RUE use of bedrails since she is currently unable to push/pull with LUE due to precautions. Hospital bed would allow pt to be completely independent with bed mobility and decrease caregiver burden as a result.  ~ [facility] for improved balance with ambulation   Tamara Hicks Tamara Stains PT, DPT 05/03/2024, 6:33 AM

## 2024-05-03 NOTE — Telephone Encounter (Signed)
 Pharmacy Patient Advocate Encounter  Received notification from [facility] that Prior Authorization for Lidocaine  5% patches  has been APPROVED from 05/03/2024 to 05/03/2025   PA #/Case ID/Reference #: [id number] KEY: [id number]

## 2024-05-04 LAB — URINALYSIS, ROUTINE W REFLEX MICROSCOPIC
Bilirubin Urine: NEGATIVE
Glucose, UA: NEGATIVE mg/dL
Hgb urine dipstick: NEGATIVE
Ketones, ur: NEGATIVE mg/dL
Nitrite: NEGATIVE
Protein, ur: NEGATIVE mg/dL
Specific Gravity, Urine: 1.019 (ref 1.005–1.030)
pH: 5 (ref 5.0–8.0)

## 2024-05-04 NOTE — Plan of Care (Signed)
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
## 2024-05-04 NOTE — Progress Notes (Signed)
 Physical Therapy Session Note  Patient Details  Name: Tamara Hicks MRN: [id number] Date of Birth: [birth year]  Today's Date: 05/04/2024 PT Individual Time: 9159-9045 PT Individual Time Calculation (min): 74 min   Short Term Goals: Week 1:  PT Short Term Goal 1 (Week 1): = LTGs due to ELOS  Skilled Therapeutic Interventions/Progress Updates:   Received pt semi-reclined in bed, pt agreeable to PT treatment, and reported pain 5/10 in L shoulder, ribs, and neck (premedicated). Pt upset and emotional regarding being told she is leaving tomorrow with no answers if she will receive a hospital bed and if there is a fee - MD notified. Session with emphasis on discharge planning, functional mobility/transfers, generalized strengthening and endurance, dynamic standing balance/coordination, ambulation, stair navigation, simulated car transfers, and bed mobility. Pt transferred semi-reclined<>sitting R EOB with HOB elevated and use of bedrails mod I.   Pt performed all transfers with [facility] and mod I throughout session. Pt ambulated in/out of bathroom with SPC and mod I and able to perform all toileting tasks without assist - pt continent of bladder. Stood at sink and performed hand hygiene and brushed teeth mod I, then ambulated 43ft with SPC and mod I to ortho gym. Pt performed simulated car transfer with SPC and mod I, then ambulated 55ft on uneven surfaces (ramp and mulch) and navigted 1 5in curb with SPC and mod I.  Pt ambulated additional 132ft with SPC and mod I to rehab apartment and practiced bed mobility on regular bed using wedge and bed assist rail. Pt able to transition into supine on wedge with mod I and increased time but required mod A to transition semi-reclined<>sitting L EOB due to intense pain. Pt tearful due to pain - provided emotional support, tissues, and water to calm pt. Pt performed furniture transfer on/off low sitting couch mod I. Discussed getting bed assist rail and wedge for  home use but that pt will still need assist from family - pt reports mom is only able to lift 5-10lbs and couldn't help and only other help is from 2 and 25 y/o children. Offerred to provide print off of bed assist rail and wedge on [employer] but pt reported she is able to find it on her own if she cannot get hospital bed. Discussed looking for recliner on [employer] or sleeping on the couch as alternative options.   Pt ambulated to/from staircase with SPC mod I and navigated 12 6in steps with L handrail and mod I while carrying cane ascending and descending alternating with a step to and step through pattern. Pt able to pick up object from floor using reacher and mod I - pt plans on purchasing reacher for home use. Pt ambulated 169ft with SPC and mod I back to room and transitioned into semi-reclined mod I. Concluded session with pt semi-reclined in bed with all needs within reach. Pt made mod I in room.   Therapy Documentation Precautions:  Precautions Precautions: Fall, Other (comment) Precaution/Restrictions Comments: L Shoulder Sling; OK  for gentle PROM/AROM of shoulder, digits, elbow Required Braces or Orthoses: Sling Restrictions Weight Bearing Restrictions Per Provider Order: Yes LUE Weight Bearing Per Provider Order: Weight bear through elbow only Other Position/Activity Restrictions: Assumed NWB until clarified by Dr  [name]: Individual Therapy Therisa HERO Zaunegger Therisa Stains PT, DPT 05/04/2024, 6:57 AM

## 2024-05-04 NOTE — Progress Notes (Signed)
 Occupational Therapy Session Note  Patient Details  Name: Tamara Hicks MRN: [id number] Date of Birth: September 18, 1971  Today's Date: 05/04/2024 OT Individual Time: 8652-8568 OT Individual Time Calculation (min): 44 min    Short Term Goals: Week 1:  OT Short Term Goal 1 (Week 1): LTG=STG 2/2 ELOS  Skilled Therapeutic Interventions/Progress Updates:    Patient agreeable to participate in OT session. Reports 7/10 pain level, premedicated.   Patient participated in skilled OT session focusing on education on home, HEP utilizing teach back method to increase IND. Patient reported needing hospital bed for mobility at home due to inability to complete supine to sit without HOB elevated. Patient demonstrated teach back method for entire HEP with breaks due to increase in pain. Patient mod I for all mobility.  Therapy Documentation Precautions:  Precautions Precautions: Fall, Other (comment) Precaution/Restrictions Comments: L Shoulder Sling; OK  for gentle PROM/AROM of shoulder, digits, elbow Required Braces or Orthoses: Sling Restrictions Weight Bearing Restrictions Per Provider Order: Yes LUE Weight Bearing Per Provider Order: Weight bear through elbow only Other Position/Activity Restrictions: Assumed NWB until clarified by Dr   [name]: Individual Therapy  Tamara Hicks 05/04/2024, 2:21 PM

## 2024-05-04 NOTE — Progress Notes (Signed)
 Physical Therapy Discharge Summary  Patient Details  Name: Tamara Hicks MRN: [id number] Date of Birth: Mar 31, 1971  Date of Discharge from PT service:May 04, 2024  Patient has met 7 of 7 long term goals due to improved activity tolerance, improved balance, improved postural control, increased strength, increased range of motion, decreased pain, ability to compensate for deficits, and improved coordination. Patient to discharge at an ambulatory level Modified Independent using SPC. Patient's care partner is independent to provide the necessary physical assistance at discharge.  All goals met  Recommendation:  Patient will benefit from ongoing skilled PT services in outpatient setting to continue to advance safe functional mobility, address ongoing impairments in functional mobility/transfers, generalized strengthening and endurance, dynamic standing balance/coordination, ambulation, and to minimize fall risk.  Equipment: SPC, recommend hospital bed  Reasons for discharge: treatment goals met  Patient/family agrees with progress made and goals achieved: Yes  PT Discharge Precautions/Restrictions Precautions Precautions: Fall;Other (comment) Precaution/Restrictions Comments: L Shoulder Sling; OK  for gentle PROM/AROM of shoulder, digits, elbow Required Braces or Orthoses: Sling Restrictions Weight Bearing Restrictions Per Provider Order: Yes LUE Weight Bearing Per Provider Order: Weight bear through elbow only Pain Interference Pain Interference Pain Effect on Sleep: 2. Occasionally Pain Interference with Therapy Activities: 1. Rarely or not at all Pain Interference with Day-to-Day Activities: 1. Rarely or not at all Cognition Overall Cognitive Status: Within Functional Limits for tasks assessed Arousal/Alertness: Awake/alert Orientation Level: Oriented X4 Memory: Appears intact Awareness: Appears intact Problem Solving: Appears intact Safety/Judgment: Appears  intact Comments: Pt reports some STM impairments Sensation Sensation Light Touch: Appears Intact Hot/Cold: Appears Intact Proprioception: Appears Intact Stereognosis: Not tested Additional Comments: pt reports intermittent numbness/tingling in L thumb, L ribcage, and L shoulder Coordination Gross Motor Movements are Fluid and Coordinated: Yes Fine Motor Movements are Fluid and Coordinated: No Finger Nose Finger Test: slower on LUE Heel Shin Test: [facility] bilaterally Motor  Motor Motor: Within Functional Limits  Mobility Bed Mobility Bed Mobility: Rolling Right;Rolling Left;Supine to Sit;Sit to Supine Rolling Right: Independent with assistive device Rolling Left: Independent with assistive device Supine to Sit: Independent with assistive device Sit to Supine: Independent with assistive device Transfers Transfers: Sit to Stand;Stand to Sit;Stand Pivot Transfers Sit to Stand: Independent with assistive device Stand to Sit: Independent with assistive device Stand Pivot Transfers: Independent with assistive device Transfer (Assistive device): Straight cane Locomotion  Gait Ambulation: Yes Gait Assistance: Independent with assistive device Gait Distance (Feet): 150 Feet Assistive device: Straight cane Gait Gait: Yes Gait Pattern: Impaired Gait Pattern: Step-to pattern;Step-through pattern;Decreased step length - right;Decreased step length - left;Poor foot clearance - left;Poor foot clearance - right;Narrow base of support Gait velocity: decreased Stairs / Additional Locomotion Stairs: Yes Stairs Assistance: Supervision/Verbal cueing Stair Management Technique: One rail Left Number of Stairs: 12 Height of Stairs: 6 Ramp: Independent with assistive device (SPC) Curb: Independent with assistive device (SPC) Pick up small object from the floor assist level: Independent with assistive device Pick up small object from the floor assistive device: reacher Wheelchair  Mobility Wheelchair Mobility: No  Trunk/Postural Assessment  Cervical Assessment Cervical Assessment: Within Functional Limits Thoracic Assessment Thoracic Assessment: Exceptions to [facility] (forward head; limited by rib and back pain) Lumbar Assessment Lumbar Assessment: Exceptions to [facility] (posterior pelvic tilt; limited by rib and back pain) Postural Control Postural Control: Within Functional Limits  Balance Balance Balance Assessed: Yes Static Sitting Balance Static Sitting - Balance Support: Feet supported;No upper extremity supported Static Sitting - Level of Assistance: 7: Independent  Dynamic Sitting Balance Dynamic Sitting - Balance Support: Feet supported;No upper extremity supported Dynamic Sitting - Level of Assistance: 7: Independent Static Standing Balance Static Standing - Balance Support: Right upper extremity supported;During functional activity (SPC) Static Standing - Level of Assistance: 6: Modified independent (Device/Increase time) Dynamic Standing Balance Dynamic Standing - Balance Support: During functional activity;Right upper extremity supported (SPC) Dynamic Standing - Level of Assistance: 6: Modified independent (Device/Increase time) Dynamic Standing - Comments: with transfers and gait Extremity Assessment  RLE Assessment RLE Assessment: Exceptions to [facility] General Strength Comments: tested sitting EOB RLE Strength Right Hip Flexion: 4-/5 Right Hip ABduction: 4/5 Right Hip ADduction: 4/5 Right Knee Flexion: 4-/5 Right Knee Extension: 4-/5 Right Ankle Dorsiflexion: 4+/5 Right Ankle Plantar Flexion: 4+/5 LLE Assessment LLE Assessment: Exceptions to [facility] General Strength Comments: tested sitting EOB LLE Strength Left Hip Flexion: 4-/5 Left Hip ABduction: 4-/5 Left Hip ADduction: 4-/5 Left Knee Flexion: 4-/5 Left Knee Extension: 4-/5 Left Ankle Dorsiflexion: 4+/5 Left Ankle Plantar Flexion: 4+/5   Leaf [employer] M Zaunegger Therisa Stains PT, DPT 05/04/2024,  7:12 AM

## 2024-05-05 MED ORDER — PANTOPRAZOLE SODIUM 20 MG PO TBEC
20.0000 mg | DELAYED_RELEASE_TABLET | Freq: Every day | ORAL | Status: DC
  Administered 2024-05-06: 20 mg via ORAL
  Filled 2024-05-05 (×2): qty 1
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
## 2024-05-05 NOTE — Progress Notes (Signed)
 Inpatient Rehabilitation Discharge Medication Review by a Pharmacist  A complete drug regimen review was completed for this patient to identify any potential clinically significant medication issues.  High Risk Drug Classes Is patient taking? Indication by Medication  Antipsychotic No   Anticoagulant No   Antibiotic No   Opioid Yes Oxycodone  prn pain  Antiplatelet No   Hypoglycemics/insulin Noe   Vasoactive Medication Yes Losartan  - HTN Torsemide  - fluid  Chemotherapy No   Other Yes Cyclobenzaprine  - muscle spasms Levothyroxine  - low thyroid Lorazepam  prn anxiety Mesalamine  - IBS Lidocaine  patch - pain Pantoprazole  - reflux     Type of Medication Issue Identified Description of Issue Recommendation(s)  Drug Interaction(s) (clinically significant)     Duplicate Therapy     Allergy      No Medication Administration End Date     Incorrect Dose     Additional Drug Therapy Needed     Significant med changes from prior encounter (inform family/care partners about these prior to discharge).    Other       Clinically significant medication issues were identified that warrant physician communication and completion of prescribed/recommended actions by midnight of the next day:  No  Name of provider notified for urgent issues identified:   Provider Method of Notification:     Pharmacist comments: None  Time spent performing this drug regimen review (minutes):  20 minutes  Thank you. Olam Monte, PharmD

## 2024-05-06 ENCOUNTER — Other Ambulatory Visit (HOSPITAL_COMMUNITY): Payer: Self-pay

## 2024-05-06 LAB — CBC WITH DIFFERENTIAL/PLATELET
Abs Immature Granulocytes: 0.03 K/uL (ref 0.00–0.07)
Basophils Absolute: 0.1 K/uL (ref 0.0–0.1)
Basophils Relative: 1 %
Eosinophils Absolute: 0.2 K/uL (ref 0.0–0.5)
Eosinophils Relative: 3 %
HCT: 39.2 % (ref 36.0–46.0)
Hemoglobin: 12.9 g/dL (ref 12.0–15.0)
Immature Granulocytes: 0 %
Lymphocytes Relative: 19 %
Lymphs Abs: 1.3 K/uL (ref 0.7–4.0)
MCH: 30 pg (ref 26.0–34.0)
MCHC: 32.9 g/dL (ref 30.0–36.0)
MCV: 91.2 fL (ref 80.0–100.0)
Monocytes Absolute: 0.4 K/uL (ref 0.1–1.0)
Monocytes Relative: 6 %
Neutro Abs: 4.7 K/uL (ref 1.7–7.7)
Neutrophils Relative %: 71 %
Platelets: 282 K/uL (ref 150–400)
RBC: 4.3 MIL/uL (ref 3.87–5.11)
RDW: 12.9 % (ref 11.5–15.5)
WBC: 6.8 K/uL (ref 4.0–10.5)
nRBC: 0 % (ref 0.0–0.2)

## 2024-05-06 LAB — URINE CULTURE: Culture: >=100000 — AB

## 2024-05-06 LAB — BASIC METABOLIC PANEL WITH GFR
Anion gap: 8 (ref 5–15)
BUN: 18 mg/dL (ref 6–20)
CO2: 24 mmol/L (ref 22–32)
Calcium: 9.9 mg/dL (ref 8.9–10.3)
Chloride: 106 mmol/L (ref 98–111)
Creatinine, Ser: 0.76 mg/dL (ref 0.44–1.00)
GFR, Estimated: >60 mL/min (ref >60)
Glucose, Bld: 116 mg/dL — ABNORMAL HIGH (ref 70–99)
Potassium: 4 mmol/L (ref 3.5–5.1)
Sodium: 138 mmol/L (ref 135–145)

## 2024-05-06 MED ORDER — PANTOPRAZOLE SODIUM 20 MG PO TBEC
20.0000 mg | DELAYED_RELEASE_TABLET | Freq: Every day | ORAL | 0 refills | Status: AC
  Filled 2024-05-06: qty 30, 30d supply, fill #0

## 2024-05-06 MED ORDER — IBUPROFEN 400 MG PO TABS
600.0000 mg | ORAL_TABLET | Freq: Three times a day (TID) | ORAL | Status: DC
  Administered 2024-05-06 – 2024-05-07 (×3): 600 mg via ORAL
  Filled 2024-05-06 (×3): qty 2

## 2024-05-06 NOTE — Progress Notes (Signed)
 Physical Therapy Session Note  Patient Details  Name: Tamara Hicks MRN: [id number] Date of Birth: 09-15-1971  Today's Date: 05/06/2024 PT Individual Time: 1300-1400 PT Individual Time Calculation (min): 60 min   Short Term Goals: Week 1:  PT Short Term Goal 1 (Week 1): = LTGs due to ELOS  Skilled Therapeutic Interventions/Progress Updates:    Pt in bed when PT arrived.  Pt was told she is going home today and was very hesitant to go since she had no hospital bed ordered for her. Pt c/o 7/10 pain to right lateral rib cage region, chest, left ASIS area, mid back, shoulders, tailbone.  Pt states she can not get OOB without HOB elevated bc it's too painful for her.  Dr. Arlana entered to discuss pt's concern and discharge.  Dr requesting PT instruct pt on different ways to get OOB safely and holding discharge until tomorrow 8/19.  Pt attempted to log roll OOB with HOB flattened. Pt very antalgic and yelling in pain. PT assisted for trunk support with mod assist.  Pt sat @ EOB for extended time due to pain.  Pt amb with SPC to ADL room to work on bed transfers with railing and wedge.  Pt very slowly and antalgically, able to get bilateral LE's on bed and using her elbows to proper herself up and bridging to middle of bed. PT instructed pt on avoiding twisting her upper body, using her RUE to reach across to railing for support to get OOB.  Pt able to do after multiple attempts. Although painful, pt able to perform mod indep both getting in/out of bed. PT provided pt with leg loop to assist LE's.  Pt able to demonstrate use properly, mod indep.  Pt states the leg loop will help.  Pt amb back to room for bed transfer with HOB flattened and multiple pillows to support trunk. Pt able to show improvement with mod indep using leg loop, railing.  PT discuss most efficient way for bed transfers using railing at home.  Pt provided ice post tx.  Pt states she got notice that she will be getting a hospital bed  delivered today.  End of session with pt in bed with all items within reach.   Therapy Documentation Precautions:  Precautions Precautions: Fall, Other (comment) Precaution/Restrictions Comments: L Shoulder Sling; OK  for gentle PROM/AROM of shoulder, digits, elbow Required Braces or Orthoses: Sling Restrictions Weight Bearing Restrictions Per Provider Order: Yes LUE Weight Bearing Per Provider Order: Weight bear through elbow only Other Position/Activity Restrictions: Assumed NWB until clarified by Dr    Pain: Pain Assessment Pain Scale: 0-10 Pain Score: 7  Pain Location: Rib cage Pain Intervention(s): Medication (See eMAR)     Therapy/Group: Individual Therapy  Arland GORMAN Fast 05/06/2024, 3:48 PM

## 2024-05-06 NOTE — Progress Notes (Signed)
 Occupational Therapy Session Note  Patient Details  Name: Tamara Hicks MRN: [id number] Date of Birth: [birth year]  Today's Date: 05/06/2024 OT Individual Time: 9196-9096 OT Individual Time Calculation (min): 60 min    Short Term Goals: Week 1:  OT Short Term Goal 1 (Week 1): LTG=STG 2/2 ELOS Week 2:  OT Short Term Goal 1 (Week 2): continue working towards LTGs d/t LOS  Skilled Therapeutic Interventions/Progress Updates:      Therapy Documentation Precautions:  Precautions Precautions: Fall, Other (comment) Precaution/Restrictions Comments: L Shoulder Sling; OK  for gentle PROM/AROM of shoulder, digits, elbow Required Braces or Orthoses: Sling Restrictions Weight Bearing Restrictions Per Provider Order: Yes LUE Weight Bearing Per Provider Order: Weight bear through elbow only Other Position/Activity Restrictions: Assumed NWB until clarified by Dr Diedre: Pt supine in bed upon OT arrival, agreeable to OT session.  Pain:  3/10 pain reported in ribs, pt presenting with lidocane patches on, shower, empathetic discussion, activity, intermittent rest breaks, distractions provided for pain management, pt reports tolerable to proceed.   ADL: OT providing therapeutic use of self in order to build rapport and discuss patient current situation and goals for therapy. OT providing skilled intervention on ADL retraining in order to increase independence with tasks and increase activity tolerance. Pt completed the full ADL tasks at mod I level within room with no LOB/SOB. OT wrapped wrist and bracing up in order to keep wrist brace on for bathing. Pt completing all mobility throughout room with mod I and SPC.   Pt supine in bed with 2 bed rails up, call light within reach and 4Ps assessed. Pt with mod I within room   Therapy/Group: Individual Therapy  Camie Hoe, OTD, OTR/L 05/06/2024, 10:40 AM

## 2024-05-06 NOTE — Progress Notes (Addendum)
 Patient had elevated diastolic BP and HR (see flowsheet)  This RN provided patient PRN Ativan  as patient stated feeling anxiety in her chest denied any other symptoms. Notified provider; advised to recheck vitals in . Vitals recheck and WDL, patient reports feeling less anxious
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
## 2024-05-06 NOTE — Plan of Care (Signed)
  Problem: Consults Goal: RH GENERAL PATIENT EDUCATION Description: See Patient Education module for education specifics. Outcome: Progressing   Problem: RH BOWEL ELIMINATION Goal: RH STG MANAGE BOWEL WITH ASSISTANCE Description: STG Manage Bowel with mod I Assistance. Outcome: Progressing Goal: RH STG MANAGE BOWEL W/MEDICATION W/ASSISTANCE Description: STG Manage Bowel with Medication with mod I  Assistance. Outcome: Progressing   Problem: RH SAFETY Goal: RH STG ADHERE TO SAFETY PRECAUTIONS W/ASSISTANCE/DEVICE Description: STG Adhere to Safety Precautions With cues Assistance/Device. Outcome: Progressing   Problem: RH PAIN MANAGEMENT Goal: RH STG PAIN MANAGED AT OR BELOW PT'S PAIN GOAL Description: < 4 with prns Outcome: Progressing   Problem: RH KNOWLEDGE DEFICIT GENERAL Goal: RH STG INCREASE KNOWLEDGE OF SELF CARE AFTER HOSPITALIZATION Description: Patient will be able to manage care at discharge using educational resources independently Outcome: Progressing

## 2024-05-07 ENCOUNTER — Other Ambulatory Visit (HOSPITAL_COMMUNITY): Payer: Self-pay

## 2024-05-07 MED ORDER — IBUPROFEN 600 MG PO TABS
600.0000 mg | ORAL_TABLET | Freq: Three times a day (TID) | ORAL | 0 refills | Status: AC
Start: 2024-05-07 — End: ?
  Filled 2024-05-07: qty 30, 10d supply, fill #0

## 2024-05-07 NOTE — Progress Notes (Signed)
 Occupational Therapy Session Note  Patient Details  Name: Tamara Hicks MRN: [id number] Date of Birth: 02-22-1971  Today's Date: 05/07/2024 OT Individual Time: 9159-9084 OT Individual Time Calculation (min): 35 min     Skilled Therapeutic Interventions/Progress Updates:    1:1 Pt received in the bed. Discussed bed mobility with patient from regular bed - pt reports she can't due to pain in session and is thankful she now has a hospital bed at home (got delivered last evening) so she will not have to perform.    Pt navigate around the room mod I. Pt wanted to continue to address dynamic balance. Pt ambulated very slowly to the gym with SPC. After seated rest break pt stepped over the 6 inch hurdles. Attempt to pick up an item from floor (4 inch off floor/ cone). Pt unable to reach it due to pain.  Also perform hanging close pins at shoulder height with right hand 5x practicing safe turning getting the pin from the mat and then turning around to hang them one by one.  Ambulated backwards with contact guard at slow rate with difficulty with achieving a normal step length. Ambulated back to room with nursing.   Therapy Documentation Precautions:  Precautions Precautions: Fall, Other (comment) Precaution/Restrictions Comments: L Shoulder Sling; OK  for gentle PROM/AROM of shoulder, digits, elbow Required Braces or Orthoses: Sling Restrictions Weight Bearing Restrictions Per Provider Order: Yes LUE Weight Bearing Per Provider Order: Non weight bearing Other Position/Activity Restrictions: Assumed NWB until clarified by Dr Pain: Ongoing pain in ribs mostly - receiving meds when arrived. Provided rest break as needed   Therapy/Group: individual   [name] [facility] 05/07/2024, 11:13 AM

## 2024-05-07 NOTE — Progress Notes (Signed)
 Inpatient Rehabilitation Discharge Medication Review by a Pharmacist  A complete drug regimen review was completed for this patient to identify any potential clinically significant medication issues.  High Risk Drug Classes Is patient taking? Indication by Medication  Antipsychotic No   Anticoagulant No   Antibiotic No   Opioid Yes Oxycodone  prn pain  Antiplatelet No   Hypoglycemics/insulin Noe   Vasoactive Medication Yes Losartan  - HTN Torsemide  - fluid  Chemotherapy No   Other Yes Cyclobenzaprine  - muscle spasms Levothyroxine  - low thyroid Lorazepam  prn anxiety Mesalamine  - IBS Lidocaine  patch - pain Pantoprazole  - reflux     Type of Medication Issue Identified Description of Issue Recommendation(s)  Drug Interaction(s) (clinically significant)     Duplicate Therapy     Allergy      No Medication Administration End Date     Incorrect Dose     Additional Drug Therapy Needed     Significant med changes from prior encounter (inform family/care partners about these prior to discharge).    Other       Clinically significant medication issues were identified that warrant physician communication and completion of prescribed/recommended actions by midnight of the next day:  No  Name of provider notified for urgent issues identified:   Provider Method of Notification:     Pharmacist comments: None  Time spent performing this drug regimen review (minutes):  20 minutes  Thank you. Olam Monte, PharmD
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
## 2024-05-08 NOTE — Progress Notes (Signed)
 Inpatient Rehabilitation Care Coordinator Discharge Note   Patient Details  Name: Tamara Hicks MRN: [id number] Date of Birth: Sep 27, 1970   Discharge location: [address]   [place] [id number]  Length of Stay:    Discharge activity level: Independent with assistive device  Home/community participation:    Patient response un:Yzjouy Literacy - How often do you need to have someone help you when you read instructions, pamphlets, or other written material from your doctor or pharmacy?: Never  Patient response un:Dnrpjo Isolation - How often do you feel lonely or isolated from those around you?: Patient declines to respond  Services provided included: MD, PT, OT, RN, SW  Financial Services:  Financial Services Utilized: Medicare    Choices offered to/list presented to:    Follow-up services arranged:  Outpatient, DME ([facility], hospital bed)    Outpatient Servicies: [facility] DME : [facility], hospial bed    Patient response to transportation need: Is the patient able to respond to transportation needs?: Yes In the past 12 months, has lack of transportation kept you from medical appointments or from getting medications?: No In the past 12 months, has lack of transportation kept you from meetings, work, or from getting things needed for daily living?: No   Patient/Family verbalized understanding of follow-up arrangements:  Yes  Individual responsible for coordination of the follow-up plan: Patient  Confirmed correct DME delivered: Tamara Hicks 05/08/2024    Comments (or additional information):Patient received DME and went home.   Summary of Stay    Date/Time Discharge Planning CSW  05/01/24 [phone] Will return home with mother, two children, and daily support from sister, Jannelle and her husband. Awaiting DME recommendations and follow up from team. DS       Tamara  Rockie Schnoor

## 2024-05-08 NOTE — Progress Notes (Signed)
 Patient ID: Tamara Hicks, female   DOB: 19-Aug-1971, 53 y.o.   MRN: [id number]  LATE NOTE:   On Monday 05/06/2024, Patient is refusing to discharge after myself, Brandi, and other members of the team have explained to her the process. Her DME has been ordered.   CSW informed patient of her appeal rights due to patients refusal to leave, a HINN12 would have to be delivered per policy but she also wanted to speak to therapy.   Update: HINN 12 notice couldn't be delivered since it says Medicare and she's Medicaid and we can't edit the form.   Update: Patient set to discharge 8/19. Transportation via [place]   Update: DME delivered to patients home 5pm on 05/06/2024.

## 2025-02-25 ENCOUNTER — Ambulatory Visit: Admitting: Allergy and Immunology

## 2025-02-25 ENCOUNTER — Ambulatory Visit: Admitting: Internal Medicine
